# Patient Record
Sex: Male | Born: 1970 | Race: White | Hispanic: No | Marital: Married | Smoking: Never smoker
Health system: Southern US, Community
[De-identification: ages and names within clinical notes are randomized; demographics above are authoritative.]

## PROBLEM LIST (undated history)

## (undated) DIAGNOSIS — F329 Major depressive disorder, single episode, unspecified: Secondary | ICD-10-CM

## (undated) DIAGNOSIS — K649 Unspecified hemorrhoids: Secondary | ICD-10-CM

## (undated) DIAGNOSIS — D649 Anemia, unspecified: Secondary | ICD-10-CM

## (undated) DIAGNOSIS — F32A Depression, unspecified: Secondary | ICD-10-CM

## (undated) DIAGNOSIS — I10 Essential (primary) hypertension: Secondary | ICD-10-CM

## (undated) DIAGNOSIS — Z9289 Personal history of other medical treatment: Secondary | ICD-10-CM

## (undated) DIAGNOSIS — F319 Bipolar disorder, unspecified: Secondary | ICD-10-CM

## (undated) DIAGNOSIS — F419 Anxiety disorder, unspecified: Secondary | ICD-10-CM

## (undated) DIAGNOSIS — K259 Gastric ulcer, unspecified as acute or chronic, without hemorrhage or perforation: Secondary | ICD-10-CM

## (undated) HISTORY — DX: Bipolar disorder, unspecified: F31.9

## (undated) HISTORY — DX: Essential (primary) hypertension: I10

## (undated) HISTORY — DX: Anxiety disorder, unspecified: F41.9

## (undated) HISTORY — DX: Depression, unspecified: F32.A

---
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
## 2005-11-03 ENCOUNTER — Encounter: Admission: RE | Admit: 2005-11-03 | Discharge: 2005-11-03 | Payer: Self-pay | Admitting: Internal Medicine

## 2006-11-21 ENCOUNTER — Encounter: Admission: RE | Admit: 2006-11-21 | Discharge: 2006-11-21 | Payer: Self-pay | Admitting: *Deleted

## 2009-07-09 ENCOUNTER — Emergency Department (HOSPITAL_COMMUNITY): Admission: EM | Admit: 2009-07-09 | Discharge: 2009-07-09 | Payer: Self-pay | Admitting: Emergency Medicine

## 2010-06-12 ENCOUNTER — Encounter: Payer: Self-pay | Admitting: Internal Medicine

## 2010-06-23 ENCOUNTER — Other Ambulatory Visit: Payer: Self-pay | Admitting: Family Medicine

## 2010-06-23 ENCOUNTER — Ambulatory Visit
Admission: RE | Admit: 2010-06-23 | Discharge: 2010-06-23 | Disposition: A | Discharge status: Home / Self Care | Payer: 59 | Source: Ambulatory Visit | Attending: Family Medicine | Admitting: Family Medicine

## 2010-06-23 DIAGNOSIS — T148XXA Other injury of unspecified body region, initial encounter: Secondary | ICD-10-CM

## 2010-08-11 LAB — CBC
MCV: 90.2 fL (ref 78.0–100.0)
Platelets: 161 10*3/uL (ref 150–400)
RBC: 4.9 MIL/uL (ref 4.22–5.81)
RDW: 12.6 % (ref 11.5–15.5)

## 2010-08-11 LAB — POCT I-STAT, CHEM 8
BUN: 18 mg/dL (ref 6–23)
Chloride: 105 mEq/L (ref 96–112)
Creatinine, Ser: 1.2 mg/dL (ref 0.4–1.5)
Glucose, Bld: 81 mg/dL (ref 70–99)
Potassium: 4 mEq/L (ref 3.5–5.1)
Sodium: 137 mEq/L (ref 135–145)

## 2010-08-11 LAB — DIFFERENTIAL
Basophils Absolute: 0 10*3/uL (ref 0.0–0.1)
Basophils Relative: 0 % (ref 0–1)
Eosinophils Absolute: 0.1 10*3/uL (ref 0.0–0.7)
Eosinophils Relative: 2 % (ref 0–5)
Lymphocytes Relative: 32 % (ref 12–46)
Lymphs Abs: 1.4 10*3/uL (ref 0.7–4.0)

## 2010-11-21 ENCOUNTER — Ambulatory Visit (INDEPENDENT_AMBULATORY_CARE_PROVIDER_SITE_OTHER): Payer: 59 | Admitting: Surgery

## 2010-12-12 ENCOUNTER — Ambulatory Visit (INDEPENDENT_AMBULATORY_CARE_PROVIDER_SITE_OTHER): Payer: 59 | Admitting: Surgery

## 2010-12-12 ENCOUNTER — Encounter (INDEPENDENT_AMBULATORY_CARE_PROVIDER_SITE_OTHER): Payer: Self-pay | Admitting: Surgery

## 2010-12-12 VITALS — BP 152/82 | HR 82 | Ht 71.0 in | Wt 248.0 lb

## 2010-12-12 DIAGNOSIS — K648 Other hemorrhoids: Secondary | ICD-10-CM

## 2010-12-12 NOTE — Patient Instructions (Signed)
See Handouts for hemorrhoids

## 2010-12-12 NOTE — Progress Notes (Signed)
Subjective:     Patient ID: Luke Kim, male   DOB: August 18, 1970, 40 y.o.   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  It used to happen only a few times a month. However now he is getting significant bleeding almost every other day. Based on concerns he was seen by Dr. Bosie Clos with gastroenterology. He had a sigmoidoscopy done last year that I am told was normal. I do not have those records in front of me. He was told he may have some mild hemorrhoidal bleeding. It wasn't too concerning.  However now it is worse. He's tried fiber in his diet. He has 2-3 bowel once a day. No severe burning irritation. No painful bowel movements. No drainage or pus. He's never had anything like this before. He's not had any prior interventions done. No family history of the anal rectal problems.  He does we'll get significant bleeding into the toilet. No large blood clots. He comes today with his wife  Review of Systems  Constitutional: Negative for fever, chills and diaphoresis.  HENT: Negative for sore throat and trouble swallowing.   Eyes: Negative for photophobia and visual disturbance.  Respiratory: Negative for choking and shortness of breath.   Cardiovascular: Negative for chest pain and palpitations.  Gastrointestinal: Negative for nausea, vomiting, abdominal pain, diarrhea, constipation, blood in stool, abdominal distention, anal bleeding and rectal pain.  Genitourinary: Negative for dysuria, urgency, difficulty urinating and testicular pain.  Musculoskeletal: Negative for myalgias, arthralgias and gait problem.  Skin: Negative for color change and rash.  Neurological: Negative for dizziness, speech difficulty, weakness and numbness.  Hematological: Negative for adenopathy.  Psychiatric/Behavioral: Negative for hallucinations, confusion and agitation.       Objective:   Physical Exam  Constitutional: He is  oriented to person, place, and time. He appears well-developed and well-nourished. No distress.  HENT:  Head: Normocephalic.  Mouth/Throat: Oropharynx is clear and moist. No oropharyngeal exudate.  Eyes: Conjunctivae and EOM are normal. Pupils are equal, round, and reactive to light. No scleral icterus.  Neck: Normal range of motion. Neck supple. No tracheal deviation present.  Cardiovascular: Normal rate, regular rhythm and intact distal pulses.   Pulmonary/Chest: Effort normal and breath sounds normal. No respiratory distress.  Abdominal: Soft. He exhibits no distension. There is no tenderness. Hernia confirmed negative in the right inguinal area and confirmed negative in the left inguinal area.  Genitourinary:       Mod irritated hemorrhoids R post > L left > R anterior.  No prolapse, fissure, fistula, abscess.  Hygeine good  I injected all 3 piles.  He tolerated procedure well.  Musculoskeletal: Normal range of motion. He exhibits no tenderness.  Lymphadenopathy:    He has no cervical adenopathy.       Right: No inguinal adenopathy present.       Left: No inguinal adenopathy present.  Neurological: He is alert and oriented to person, place, and time. No cranial nerve deficit. He exhibits normal muscle tone. Coordination normal.  Skin: Skin is warm and dry. No rash noted. He is not diaphoretic. No erythema. No pallor.  Psychiatric: He has a normal mood and affect. His behavior is normal. Judgment and thought content normal.       Assessment:     Hemorrhoids as with the bleeding but no significant prolapse. Injected.   Plan:     I gave the standard discussion for  hemorrhoids. I gave him handouts.  Acute thing is for him to have one soft bowel movement a day. Consider backing off weightlifting as probably increasing rectal pressure and and making the bleeding more likely.  If he rebleed,s he can get another injection. The backup plan would be do a THD hemorrhoidal ligation. However  I don't think that its going to come to that. He and his wife felt reassured. They will follow up p.r.n.

## 2011-06-07 ENCOUNTER — Other Ambulatory Visit: Payer: Self-pay | Admitting: Family Medicine

## 2011-06-07 DIAGNOSIS — R7989 Other specified abnormal findings of blood chemistry: Secondary | ICD-10-CM

## 2011-06-20 ENCOUNTER — Other Ambulatory Visit: Payer: 59

## 2011-07-03 ENCOUNTER — Inpatient Hospital Stay: Admission: RE | Admit: 2011-07-03 | Payer: 59 | Source: Ambulatory Visit

## 2014-04-04 ENCOUNTER — Inpatient Hospital Stay (HOSPITAL_COMMUNITY): Payer: 59

## 2014-04-04 ENCOUNTER — Encounter (HOSPITAL_COMMUNITY): Payer: Self-pay | Admitting: Emergency Medicine

## 2014-04-04 ENCOUNTER — Inpatient Hospital Stay (HOSPITAL_COMMUNITY)
Admission: EM | Admit: 2014-04-04 | Discharge: 2014-04-07 | DRG: 394 | Disposition: A | Discharge status: Home / Self Care | Payer: 59 | Attending: Internal Medicine | Admitting: Internal Medicine

## 2014-04-04 DIAGNOSIS — R05 Cough: Secondary | ICD-10-CM

## 2014-04-04 DIAGNOSIS — K644 Residual hemorrhoidal skin tags: Secondary | ICD-10-CM | POA: Diagnosis present

## 2014-04-04 DIAGNOSIS — K449 Diaphragmatic hernia without obstruction or gangrene: Secondary | ICD-10-CM | POA: Diagnosis present

## 2014-04-04 DIAGNOSIS — T426X5A Adverse effect of other antiepileptic and sedative-hypnotic drugs, initial encounter: Secondary | ICD-10-CM | POA: Diagnosis present

## 2014-04-04 DIAGNOSIS — K640 First degree hemorrhoids: Secondary | ICD-10-CM | POA: Diagnosis present

## 2014-04-04 DIAGNOSIS — K648 Other hemorrhoids: Secondary | ICD-10-CM | POA: Diagnosis present

## 2014-04-04 DIAGNOSIS — D62 Acute posthemorrhagic anemia: Secondary | ICD-10-CM | POA: Diagnosis present

## 2014-04-04 DIAGNOSIS — R4182 Altered mental status, unspecified: Secondary | ICD-10-CM

## 2014-04-04 DIAGNOSIS — N179 Acute kidney failure, unspecified: Secondary | ICD-10-CM | POA: Diagnosis present

## 2014-04-04 DIAGNOSIS — K219 Gastro-esophageal reflux disease without esophagitis: Secondary | ICD-10-CM | POA: Diagnosis present

## 2014-04-04 DIAGNOSIS — F419 Anxiety disorder, unspecified: Secondary | ICD-10-CM | POA: Diagnosis present

## 2014-04-04 DIAGNOSIS — Z79899 Other long term (current) drug therapy: Secondary | ICD-10-CM

## 2014-04-04 DIAGNOSIS — N289 Disorder of kidney and ureter, unspecified: Secondary | ICD-10-CM

## 2014-04-04 DIAGNOSIS — R059 Cough, unspecified: Secondary | ICD-10-CM

## 2014-04-04 DIAGNOSIS — R410 Disorientation, unspecified: Secondary | ICD-10-CM | POA: Diagnosis present

## 2014-04-04 DIAGNOSIS — T443X4A Poisoning by other parasympatholytics [anticholinergics and antimuscarinics] and spasmolytics, undetermined, initial encounter: Secondary | ICD-10-CM

## 2014-04-04 DIAGNOSIS — I10 Essential (primary) hypertension: Secondary | ICD-10-CM | POA: Diagnosis present

## 2014-04-04 DIAGNOSIS — D509 Iron deficiency anemia, unspecified: Secondary | ICD-10-CM

## 2014-04-04 DIAGNOSIS — D649 Anemia, unspecified: Secondary | ICD-10-CM | POA: Diagnosis present

## 2014-04-04 LAB — CBC
HCT: 18.4 % — ABNORMAL LOW (ref 39.0–52.0)
Hemoglobin: 5.2 g/dL — CL (ref 13.0–17.0)
MCH: 17.4 pg — AB (ref 26.0–34.0)
MCHC: 28.3 g/dL — ABNORMAL LOW (ref 30.0–36.0)
MCV: 61.7 fL — ABNORMAL LOW (ref 78.0–100.0)
PLATELETS: 350 10*3/uL (ref 150–400)
RBC: 2.98 MIL/uL — ABNORMAL LOW (ref 4.22–5.81)
RDW: 17.8 % — ABNORMAL HIGH (ref 11.5–15.5)
WBC: 9.5 10*3/uL (ref 4.0–10.5)

## 2014-04-04 LAB — ABO/RH: ABO/RH(D): O NEG

## 2014-04-04 LAB — COMPREHENSIVE METABOLIC PANEL
ALT: 41 U/L (ref 0–53)
ANION GAP: 13 (ref 5–15)
AST: 38 U/L — AB (ref 0–37)
Albumin: 3.5 g/dL (ref 3.5–5.2)
Alkaline Phosphatase: 68 U/L (ref 39–117)
BILIRUBIN TOTAL: 0.2 mg/dL — AB (ref 0.3–1.2)
BUN: 26 mg/dL — AB (ref 6–23)
CHLORIDE: 103 meq/L (ref 96–112)
CO2: 22 meq/L (ref 19–32)
Calcium: 8.8 mg/dL (ref 8.4–10.5)
Creatinine, Ser: 1.47 mg/dL — ABNORMAL HIGH (ref 0.50–1.35)
GFR, EST AFRICAN AMERICAN: 67 mL/min — AB (ref >90)
GFR, EST NON AFRICAN AMERICAN: 58 mL/min — AB (ref >90)
GLUCOSE: 124 mg/dL — AB (ref 70–99)
POTASSIUM: 5.2 meq/L (ref 3.7–5.3)
SODIUM: 138 meq/L (ref 137–147)
TOTAL PROTEIN: 6.5 g/dL (ref 6.0–8.3)

## 2014-04-04 LAB — CBC WITH DIFFERENTIAL/PLATELET
BASOS PCT: 0 % (ref 0–1)
Basophils Absolute: 0 10*3/uL (ref 0.0–0.1)
EOS PCT: 0 % (ref 0–5)
Eosinophils Absolute: 0 10*3/uL (ref 0.0–0.7)
HCT: 17.4 % — ABNORMAL LOW (ref 39.0–52.0)
HEMOGLOBIN: 4.9 g/dL — AB (ref 13.0–17.0)
LYMPHS PCT: 7 % — AB (ref 12–46)
Lymphs Abs: 0.6 10*3/uL — ABNORMAL LOW (ref 0.7–4.0)
MCH: 17.4 pg — ABNORMAL LOW (ref 26.0–34.0)
MCHC: 28.2 g/dL — AB (ref 30.0–36.0)
MCV: 61.9 fL — ABNORMAL LOW (ref 78.0–100.0)
Monocytes Absolute: 0.5 10*3/uL (ref 0.1–1.0)
Monocytes Relative: 6 % (ref 3–12)
NEUTROS ABS: 7.3 10*3/uL (ref 1.7–7.7)
Neutrophils Relative %: 87 % — ABNORMAL HIGH (ref 43–77)
Platelets: 320 10*3/uL (ref 150–400)
RBC: 2.81 MIL/uL — AB (ref 4.22–5.81)
RDW: 17.8 % — ABNORMAL HIGH (ref 11.5–15.5)
WBC: 8.4 10*3/uL (ref 4.0–10.5)

## 2014-04-04 LAB — SALICYLATE LEVEL

## 2014-04-04 LAB — RAPID URINE DRUG SCREEN, HOSP PERFORMED
Amphetamines: NOT DETECTED
Barbiturates: NOT DETECTED
Benzodiazepines: NOT DETECTED
COCAINE: NOT DETECTED
OPIATES: POSITIVE — AB
Tetrahydrocannabinol: NOT DETECTED

## 2014-04-04 LAB — URINALYSIS, ROUTINE W REFLEX MICROSCOPIC
Bilirubin Urine: NEGATIVE
GLUCOSE, UA: NEGATIVE mg/dL
Hgb urine dipstick: NEGATIVE
KETONES UR: NEGATIVE mg/dL
LEUKOCYTES UA: NEGATIVE
Nitrite: NEGATIVE
PROTEIN: 30 mg/dL — AB
Specific Gravity, Urine: 1.018 (ref 1.005–1.030)
UROBILINOGEN UA: 0.2 mg/dL (ref 0.0–1.0)
pH: 6 (ref 5.0–8.0)

## 2014-04-04 LAB — PREPARE RBC (CROSSMATCH)

## 2014-04-04 LAB — HEMOGLOBIN: HEMOGLOBIN: 5.2 g/dL — AB (ref 13.0–17.0)

## 2014-04-04 LAB — ETHANOL: Alcohol, Ethyl (B): <11 mg/dL (ref 0–11)

## 2014-04-04 LAB — ACETAMINOPHEN LEVEL: Acetaminophen (Tylenol), Serum: <15 ug/mL (ref 10–30)

## 2014-04-04 LAB — URINE MICROSCOPIC-ADD ON

## 2014-04-04 LAB — MRSA PCR SCREENING: MRSA by PCR: NEGATIVE

## 2014-04-04 LAB — GLUCOSE, CAPILLARY: GLUCOSE-CAPILLARY: 122 mg/dL — AB (ref 70–99)

## 2014-04-04 MED ORDER — HYDRALAZINE HCL 20 MG/ML IJ SOLN: 5.0000 mg | INTRAMUSCULAR | Status: DC | PRN

## 2014-04-04 MED ORDER — SODIUM CHLORIDE 0.9 % IV SOLN
INTRAVENOUS | Status: DC
  Administered 2014-04-04 – 2014-04-06 (×3): via INTRAVENOUS

## 2014-04-04 MED ORDER — SODIUM CHLORIDE 0.9 % IJ SOLN
3.0000 mL | Freq: Two times a day (BID) | INTRAMUSCULAR | Status: DC
  Administered 2014-04-06 – 2014-04-07 (×3): 3 mL via INTRAVENOUS

## 2014-04-04 MED ORDER — ACETAMINOPHEN 325 MG PO TABS: 650.0000 mg | ORAL_TABLET | Freq: Four times a day (QID) | ORAL | Status: DC | PRN

## 2014-04-04 MED ORDER — ACETAMINOPHEN 650 MG RE SUPP: 650.0000 mg | Freq: Four times a day (QID) | RECTAL | Status: DC | PRN

## 2014-04-04 MED ORDER — ONDANSETRON HCL 4 MG/2ML IJ SOLN: 4.0000 mg | Freq: Three times a day (TID) | INTRAMUSCULAR | Status: DC | PRN

## 2014-04-04 MED ORDER — ONDANSETRON HCL 4 MG/2ML IJ SOLN: 4.0000 mg | Freq: Four times a day (QID) | INTRAMUSCULAR | Status: DC | PRN

## 2014-04-04 MED ORDER — INFLUENZA VAC SPLIT QUAD 0.5 ML IM SUSY
0.5000 mL | PREFILLED_SYRINGE | INTRAMUSCULAR | Status: AC
  Administered 2014-04-07: 0.5 mL via INTRAMUSCULAR
  Filled 2014-04-04: qty 0.5

## 2014-04-04 MED ORDER — ALPRAZOLAM 0.5 MG PO TABS
1.0000 mg | ORAL_TABLET | Freq: Two times a day (BID) | ORAL | Status: DC
  Administered 2014-04-04 – 2014-04-07 (×6): 1 mg via ORAL
  Filled 2014-04-04 (×6): qty 2

## 2014-04-04 MED ORDER — SODIUM CHLORIDE 0.9 % IV SOLN
Freq: Once | INTRAVENOUS | Status: AC
  Administered 2014-04-04: via INTRAVENOUS

## 2014-04-04 MED ORDER — SODIUM CHLORIDE 0.9 % IV BOLUS (SEPSIS)
1000.0000 mL | Freq: Once | INTRAVENOUS | Status: AC
  Administered 2014-04-04: 1000 mL via INTRAVENOUS

## 2014-04-04 MED ORDER — AMLODIPINE BESYLATE 10 MG PO TABS
10.0000 mg | ORAL_TABLET | Freq: Every day | ORAL | Status: DC
  Administered 2014-04-04 – 2014-04-07 (×4): 10 mg via ORAL
  Filled 2014-04-04 (×4): qty 1

## 2014-04-04 MED ORDER — ONDANSETRON HCL 4 MG PO TABS: 4.0000 mg | ORAL_TABLET | Freq: Four times a day (QID) | ORAL | Status: DC | PRN
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
## 2014-04-05 LAB — TYPE AND SCREEN
ABO/RH(D): O NEG
Antibody Screen: NEGATIVE
UNIT DIVISION: 0
Unit division: 0

## 2014-04-05 LAB — COMPREHENSIVE METABOLIC PANEL
ALBUMIN: 3.4 g/dL — AB (ref 3.5–5.2)
ALK PHOS: 72 U/L (ref 39–117)
ALT: 39 U/L (ref 0–53)
AST: 46 U/L — AB (ref 0–37)
Anion gap: 13 (ref 5–15)
BUN: 16 mg/dL (ref 6–23)
CALCIUM: 8.8 mg/dL (ref 8.4–10.5)
CO2: 23 mEq/L (ref 19–32)
Chloride: 100 mEq/L (ref 96–112)
Creatinine, Ser: 1.13 mg/dL (ref 0.50–1.35)
GFR calc non Af Amer: 79 mL/min — ABNORMAL LOW (ref >90)
GLUCOSE: 88 mg/dL (ref 70–99)
POTASSIUM: 4.3 meq/L (ref 3.7–5.3)
SODIUM: 136 meq/L — AB (ref 137–147)
Total Bilirubin: 0.5 mg/dL (ref 0.3–1.2)
Total Protein: 6.7 g/dL (ref 6.0–8.3)

## 2014-04-05 LAB — VITAMIN B12: VITAMIN B 12: 678 pg/mL (ref 211–911)

## 2014-04-05 LAB — IRON AND TIBC
Iron: <10 ug/dL — ABNORMAL LOW (ref 42–135)
UIBC: 535 ug/dL — ABNORMAL HIGH (ref 125–400)

## 2014-04-05 LAB — CBC
HEMATOCRIT: 24.1 % — AB (ref 39.0–52.0)
Hemoglobin: 7.1 g/dL — ABNORMAL LOW (ref 13.0–17.0)
MCH: 19.4 pg — AB (ref 26.0–34.0)
MCHC: 29.5 g/dL — AB (ref 30.0–36.0)
MCV: 65.8 fL — AB (ref 78.0–100.0)
Platelets: 346 10*3/uL (ref 150–400)
RBC: 3.66 MIL/uL — AB (ref 4.22–5.81)
RDW: 21 % — ABNORMAL HIGH (ref 11.5–15.5)
WBC: 8.2 10*3/uL (ref 4.0–10.5)

## 2014-04-05 MED ORDER — PEG 3350-KCL-NA BICARB-NACL 420 G PO SOLR
4000.0000 mL | Freq: Once | ORAL | Status: AC
  Administered 2014-04-05: 4000 mL via ORAL
  Filled 2014-04-05: qty 4000

## 2014-04-05 MED ORDER — BISACODYL 5 MG PO TBEC
10.0000 mg | DELAYED_RELEASE_TABLET | Freq: Once | ORAL | Status: AC
  Administered 2014-04-05: 10 mg via ORAL
  Filled 2014-04-05: qty 2

## 2014-04-05 NOTE — Consult Note (Signed)
[facility] Gastroenterology Consultation Note  Referring Provider:  Dr. Rickey Barbara [facility]) Primary Care Physician:  Thora Lance, MD Primary Gastroenterologist:  Dr. Charlott Rakes  Reason for Consultation:  Iron-deficiency anemia, hematochezia  HPI: Luke Kim is a 43 y.o. male admitted to hospital after he passed out yesterday at work.  He has had intermittent troubles with hematochezia for many years, would start and then resolve with expectant management.  He saw Dr. Bosie Clos in 2011, at which time he had a CT scan (unrevealing) as well as a sigmoidoscopy which showed internal hemorrhoids but was otherwise normal.  Patient's hematochezia continued after seeing Dr. Bosie Clos, but would eventually slow down enough each time that patient didn't end up feeling he needed medical evaluation.  Of late, he's had more rampant bleeding, as well as sense of looser (albeit still intermittently so) stools.  No hematemesis, nausea, vomiting, dysphagia.  No change in bowel habits, specifically no trend to constipation, obstipation, fecal straining.  No abdominal pain.  Good appetite.  No weight loss.  No prior full colonoscopy.  No prior endoscopy.  ? Father with ulcers.  ? Family members with colon polyps.   Past Medical History  Diagnosis Date  . Anxiety   . Hypertension     No past surgical history on file.  Prior to Admission medications   Medication Sig Start Date End Date Taking? Authorizing Provider  ALPRAZolam Prudy Feeler) 1 MG tablet Take 1 mg by mouth 2 (two) times daily.     Yes Historical Provider, MD  amLODipine (NORVASC) 10 MG tablet Take 10 mg by mouth daily. 03/16/14  Yes Historical Provider, MD  benazepril (LOTENSIN) 20 MG tablet Take 20 mg by mouth 2 (two) times daily.     Yes Historical Provider, MD  diphenhydrAMINE (SOMINEX) 25 MG tablet Take 25 mg by mouth at bedtime as needed.     Yes Historical Provider, MD  hydrOXYzine (ATARAX/VISTARIL) 50 MG tablet Take 50-100 mg by mouth at  bedtime as needed.  03/04/14  Yes Historical Provider, MD  methylcellulose oral powder Take 1 packet by mouth daily.    Yes Historical Provider, MD  Milk Thistle 140 MG CAPS Take 140 mg by mouth daily.    Yes Historical Provider, MD  sertraline (ZOLOFT) 25 MG tablet Take 25 mg by mouth daily.   Yes Historical Provider, MD  vitamin B-12 (CYANOCOBALAMIN) 100 MCG tablet Take 50 mcg by mouth daily.     Yes Historical Provider, MD    Current Facility-Administered Medications  Medication Dose Route Frequency Provider Last Rate Last Dose  . 0.9 %  sodium chloride infusion   Intravenous Continuous Jerald Kief, MD 75 mL/hr at 04/04/14 2330    . acetaminophen (TYLENOL) tablet 650 mg  650 mg Oral Q6H PRN Jerald Kief, MD       Or  . acetaminophen (TYLENOL) suppository 650 mg  650 mg Rectal Q6H PRN Jerald Kief, MD      . ALPRAZolam Prudy Feeler) tablet 1 mg  1 mg Oral BID Jerald Kief, MD   1 mg at 04/05/14 0948  . amLODipine (NORVASC) tablet 10 mg  10 mg Oral Daily Jerald Kief, MD   10 mg at 04/05/14 0948  . bisacodyl (DULCOLAX) EC tablet 10 mg  10 mg Oral Once Willis Modena, MD      . hydrALAZINE (APRESOLINE) injection 5 mg  5 mg Intravenous Q4H PRN Jerald Kief, MD      . Influenza vac split quadrivalent  PF (FLUARIX) injection 0.5 mL  0.5 mL Intramuscular Tomorrow-1000 Jerald KiefStephen K Chiu, MD      . ondansetron [facility](ZOFRAN) tablet 4 mg  4 mg Oral Q6H PRN Jerald KiefStephen K Chiu, MD       Or  . ondansetron [facility](ZOFRAN) injection 4 mg  4 mg Intravenous Q6H PRN Jerald KiefStephen K Chiu, MD      . polyethylene glycol-electrolytes (NuLYTELY/GoLYTELY) solution 4,000 mL  4,000 mL Oral Once Willis ModenaWilliam Remmington Urieta, MD      . sodium chloride 0.9 % injection 3 mL  3 mL Intravenous Q12H Jerald KiefStephen K Chiu, MD   3 mL at 04/04/14 2154    Allergies as of 04/04/2014  . (No Known Allergies)    No family history on file.  History   Social History  . Marital Status: Married    Spouse Name: N/A    Number of Children: N/A  . Years of  Education: N/A   Occupational History  . Not on file.   Social History Main Topics  . Smoking status: Never Smoker   . Smokeless tobacco: Not on file  . Alcohol Use: Yes  . Drug Use: No  . Sexual Activity: Not on file   Other Topics Concern  . Not on file   Social History Narrative    Review of Systems: Positive = bold Gen: Denies any fever, chills, rigors, night sweats, anorexia, fatigue, weakness, malaise, involuntary weight loss, and sleep disorder CV: Denies chest pain, angina, palpitations, syncope, orthopnea, PND, peripheral edema, and claudication. Resp: Denies dyspnea, cough, sputum, wheezing, coughing up blood. GI: Described in detail in HPI.    GU : Denies urinary burning, blood in urine, urinary frequency, urinary hesitancy, nocturnal urination, and urinary incontinence. MS: Denies joint pain or swelling.  Denies muscle weakness, cramps, atrophy.  Derm: Denies rash, itching, oral ulcerations, hives, unhealing ulcers.  Psych: Denies depression, anxiety, memory loss, suicidal ideation, hallucinations,  and confusion. Heme: Denies bruising, bleeding, and enlarged lymph nodes. Neuro:  Denies any headaches, dizziness, paresthesias. Endo:  Denies any problems with DM, thyroid, adrenal function.  Physical Exam: Vital signs in last 24 hours: Temp:  [98 F (36.7 C)-100 F (37.8 C)] 98 F (36.7 C) (11/15 1226) Pulse Rate:  [93-116] 93 (11/15 1226) Resp:  [14-34] 17 (11/15 1226) BP: (114-161)/(60-95) 120/60 mmHg (11/15 1226) SpO2:  [90 %-100 %] 100 % (11/15 1226) Weight:  [111.6 kg (246 lb 0.5 oz)-113.399 kg (250 lb)] 111.6 kg (246 lb 0.5 oz) (11/14 1747)   General:   Alert,  Well-developed, well-nourished, pleasant and cooperative in NAD Head:  Normocephalic and atraumatic. Eyes:  Sclera clear, no icterus.   Conjunctiva pale Ears:  Normal auditory acuity. Nose:  No deformity, discharge,  or lesions. Mouth:  No deformity or lesions.  Oropharynx pale but somewhat  dry. Neck:  Supple; no masses or thyromegaly. Lungs:  Clear throughout to auscultation.   No wheezes, crackles, or rhonchi. No acute distress. Heart:  Regular rate and rhythm; no murmurs, clicks, rubs,  or gallops. Abdomen:  Soft, nontender and nondistended. No masses, hepatosplenomegaly or hernias noted. Normal bowel sounds, without guarding, and without rebound.     Msk:  Symmetrical without gross deformities. Normal posture. Pulses:  Normal pulses noted. Extremities:  Without clubbing or edema. Neurologic:  Alert and  oriented x4;  Diffusely weak, otherwise grossly normal neurologically. Skin:  Intact without significant lesions or rashes. Psych:  Alert and cooperative. Normal mood and affect.  Lab Results:  Recent Labs  04/04/14 1333  04/04/14 1414 04/04/14 1629 04/05/14 0235  WBC 9.5 8.4  --  8.2  HGB 5.2* 4.9* 5.2* 7.1*  HCT 18.4* 17.4*  --  24.1*  PLT 350 320  --  346   BMET  Recent Labs  04/04/14 1333 04/05/14 0235  NA 138 136*  K 5.2 4.3  CL 103 100  CO2 22 23  GLUCOSE 124* 88  BUN 26* 16  CREATININE 1.47* 1.13  CALCIUM 8.8 8.8   LFT  Recent Labs  04/05/14 0235  PROT 6.7  ALBUMIN 3.4*  AST 46*  ALT 39  ALKPHOS 72  BILITOT 0.5   PT/INR No results for input(s): LABPROT, INR in the last 72 hours.  Studies/Results: Dg Chest 2 View  04/04/2014   CLINICAL DATA:  Cough.  ICD10: R 0 5  EXAM: CHEST  2 VIEW  COMPARISON:  None.  FINDINGS: Midline trachea. Mild cardiomegaly. Mediastinal contours otherwise within normal limits. No pleural effusion or pneumothorax. No lobar consolidation. Minimally low lung volumes on the frontal radiograph. The pulmonary interstitium is mildly prominent.  IMPRESSION: Mild cardiomegaly. Apparent pulmonary interstitial prominence is favored to be due to mildly low lung volumes. Mild pulmonary venous congestion could look similar.   Electronically Signed   By: Jeronimo GreavesKyle  Talbot M.D.   On: 04/04/2014 15:57    Impression:  1.   Hematochezia, intermittent and ongoing for years.  Hemorrhoids is still a leading consideration. 2.  Chronic, but intermittent, loose stools. 3.  Microcytic anemia with iron-deficiency indices.  In this case, I'm not convinced patient's intermittent hemorrhoidal bleeding is causing his profound iron-deficiency anemia, though this is still possible.  Patient could have celiac disease, AVMs, inflammatory bowel disease, or host of other processes at work.  Given patient's lack of change in bowel habits and lack of weight loss, I'm less inclined to think this is malignancy, but certainly it is still a consideration.  Plan:  1.  Agree with volume repletion (IV fluids, transfusion PRBCs). 2.  Endoscopy (with small bowel biopsies to exclude celiac disease) and colonoscopy (with terminal ileal investigation, as deemed necessary based on rest of findings on colonoscopy) for tomorrow, tentatively around 11:30 am.  Will us propofol. 3.  Clear liquid diet today, NPO after midnight. 4.  Next step in management pending endoscopy and colonoscopy findings.   LOS: 1 day   Nyimah Shadduck M  04/05/2014, 1:41 PM

## 2014-04-05 NOTE — Progress Notes (Signed)
[facility] PROGRESS NOTE  Luke MachoLuke Kim ZOX:[id number] DOB: 07/09/1970 DOA: 04/04/2014 PCP: Thora LanceEHINGER,ROBERT R, MD  Assessment/Plan: 1. Microcytic anemia likely from LGI bleeding 1. No evidence of recurrent bleed since admission 2. Hgb rose nicely from 5 to 7gm after 2 units PRBC's 3. Pt is iron deficient with iron of <10 4. Pt is s/p flex sig in 2012 by Dr. Bosie ClosSchooler, with findings of hemorrhoids per report 5. Given presenting anemia, will consult GI for further assistance 6. Pt otherwise hemodynamically stable at this time 2. Mental status change 1. Much improved 2. Question sedating meds taken the night prior to admission 3. Hx hemorrhoids 1. Previously treated in 2012 2. Question if related to above issue 4. DVT prophylaxis 1. SCD's 5. HTN 1. Stable 2. Monitor 6. ARF 1. Resolved with PRBC transfusion  Code Status: Full Family Communication: Pt in room Disposition Plan: pending   Consultants:  GI  Procedures:    Antibiotics:  none (indicate start date, and stop date if known)  HPI/Subjective: No acute events noted overnight  Objective: Filed Vitals:   04/04/14 2332 04/04/14 2335 04/05/14 0402 04/05/14 0811  BP: 122/69 122/69 129/67 122/65  Pulse: 96 95 100 95  Temp: 98.2 F (36.8 C)  98.1 F (36.7 C) 98 F (36.7 C)  TempSrc: Oral  Oral Oral  Resp: 28 26 29 14   Height:      Weight:      SpO2: 100% 99% 99% 98%    Intake/Output Summary (Last 24 hours) at 04/05/14 0953 Last data filed at 04/05/14 0900  Gross per 24 hour  Intake   1440 ml  Output   6125 ml  Net  -4685 ml   Filed Weights   04/04/14 1342 04/04/14 1747  Weight: 113.399 kg (250 lb) 111.6 kg (246 lb 0.5 oz)    Exam:   General:  Awake, in nad  Cardiovascular: regular, s1, s2  Respiratory: normal resp effort, no wheezing  Abdomen: soft,nondistended  Musculoskeletal: perfused, no clubbing   Data Reviewed: Basic Metabolic Panel:  Recent Labs Lab 04/04/14 1333  04/05/14 0235  NA 138 136*  K 5.2 4.3  CL 103 100  CO2 22 23  GLUCOSE 124* 88  BUN 26* 16  CREATININE 1.47* 1.13  CALCIUM 8.8 8.8   Liver Function Tests:  Recent Labs Lab 04/04/14 1333 04/05/14 0235  AST 38* 46*  ALT 41 39  ALKPHOS 68 72  BILITOT 0.2* 0.5  PROT 6.5 6.7  ALBUMIN 3.5 3.4*   No results for input(s): LIPASE, AMYLASE in the last 168 hours. No results for input(s): AMMONIA in the last 168 hours. CBC:  Recent Labs Lab 04/04/14 1333 04/04/14 1414 04/04/14 1629 04/05/14 0235  WBC 9.5 8.4  --  8.2  NEUTROABS  --  7.3  --   --   HGB 5.2* 4.9* 5.2* 7.1*  HCT 18.4* 17.4*  --  24.1*  MCV 61.7* 61.9*  --  65.8*  PLT 350 320  --  346   Cardiac Enzymes: No results for input(s): CKTOTAL, CKMB, CKMBINDEX, TROPONINI in the last 168 hours. BNP (last 3 results) No results for input(s): PROBNP in the last 8760 hours. CBG:  Recent Labs Lab 04/04/14 1741  GLUCAP 122*    Recent Results (from the past 240 hour(s))  MRSA PCR Screening     Status: None   Collection Time: 04/04/14  5:55 PM  Result Value Ref Range Status   MRSA by PCR NEGATIVE NEGATIVE Final    Comment:  The GeneXpert MRSA Assay (FDA approved for NASAL specimens only), is one component of a comprehensive MRSA colonization surveillance program. It is not intended to diagnose MRSA infection nor to guide or monitor treatment for MRSA infections.      Studies: Dg Chest 2 View  04/04/2014   CLINICAL DATA:  Cough.  ICD10: R 0 5  EXAM: CHEST  2 VIEW  COMPARISON:  None.  FINDINGS: Midline trachea. Mild cardiomegaly. Mediastinal contours otherwise within normal limits. No pleural effusion or pneumothorax. No lobar consolidation. Minimally low lung volumes on the frontal radiograph. The pulmonary interstitium is mildly prominent.  IMPRESSION: Mild cardiomegaly. Apparent pulmonary interstitial prominence is favored to be due to mildly low lung volumes. Mild pulmonary venous congestion could look  similar.   Electronically Signed   By: Jeronimo GreavesKyle  Talbot M.D.   On: 04/04/2014 15:57    Scheduled Meds: . ALPRAZolam  1 mg Oral BID  . amLODipine  10 mg Oral Daily  . Influenza vac split quadrivalent PF  0.5 mL Intramuscular Tomorrow-1000  . sodium chloride  3 mL Intravenous Q12H   Continuous Infusions: . sodium chloride 75 mL/hr at 04/04/14 2330    Principal Problem:   Hemorrhoids, internal, with bleeding Active Problems:   Severe anemia   Mental status change   Microcytic anemia   Anemia  Time spent: 35min  Davan Hark K  [facility] Pager [phone]. If 7PM-7AM, please contact night-coverage at [email or web address], password [email or web address] 04/05/2014, 9:53 AM  LOS: 1 day

## 2014-04-05 NOTE — Plan of Care (Signed)
Problem: Consults Goal: General Medical Patient Education See Patient Education Module for specific education.  Outcome: Progressing Goal: Skin Care Protocol Initiated - if Braden Score 18 or less If consults are not indicated, leave blank or document N/A  Outcome: Not Applicable Date Met:  48/25/00 Goal: Nutrition Consult-if indicated Outcome: Not Applicable Date Met:  37/04/88 Goal: Diabetes Guidelines if Diabetic/Glucose > 140 If diabetic or lab glucose is > 140 mg/dl - Initiate Diabetes/Hyperglycemia Guidelines & Document Interventions  Outcome: Not Applicable Date Met:  89/16/94  Problem: Phase I Progression Outcomes Goal: Pain controlled with appropriate interventions Outcome: Progressing Goal: OOB as tolerated unless otherwise ordered Outcome: Progressing Goal: Initial discharge plan identified Outcome: Progressing Goal: Voiding-avoid urinary catheter unless indicated Outcome: Completed/Met Date Met:  04/05/14 Goal: Hemodynamically stable Outcome: Progressing  Problem: Phase II Progression Outcomes Goal: Progress activity as tolerated unless otherwise ordered Outcome: Progressing Goal: Discharge plan established Outcome: Progressing Goal: Vital signs remain stable Outcome: Progressing Goal: IV changed to normal saline lock Outcome: Progressing Goal: Obtain order to discontinue catheter if appropriate Outcome: Not Applicable Date Met:  50/38/88  Problem: Phase III Progression Outcomes Goal: Pain controlled on oral analgesia Outcome: Progressing Goal: Activity at appropriate level-compared to baseline (UP IN CHAIR FOR HEMODIALYSIS)  Outcome: Progressing Goal: Voiding independently Outcome: Completed/Met Date Met:  04/05/14 Goal: IV/normal saline lock discontinued Outcome: Progressing Goal: Foley discontinued Outcome: Not Applicable Date Met:  28/00/34 Goal: Discharge plan remains appropriate-arrangements made Outcome: Progressing  Problem: Discharge  Progression Outcomes Goal: Discharge plan in place and appropriate Outcome: Progressing Goal: Pain controlled with appropriate interventions Outcome: Progressing Goal: Hemodynamically stable Outcome: Progressing Goal: Complications resolved/controlled Outcome: Progressing Goal: Tolerating diet Outcome: Progressing Goal: Activity appropriate for discharge plan Outcome: Progressing

## 2014-04-06 ENCOUNTER — Encounter (HOSPITAL_COMMUNITY)
Admission: EM | Disposition: A | Discharge status: Home / Self Care | Payer: Self-pay | Source: Home / Self Care | Attending: Internal Medicine

## 2014-04-06 ENCOUNTER — Inpatient Hospital Stay (HOSPITAL_COMMUNITY): Payer: 59 | Admitting: Anesthesiology

## 2014-04-06 ENCOUNTER — Encounter (HOSPITAL_COMMUNITY): Payer: Self-pay

## 2014-04-06 HISTORY — PX: COLONOSCOPY WITH PROPOFOL: SHX5780

## 2014-04-06 HISTORY — PX: ESOPHAGOGASTRODUODENOSCOPY (EGD) WITH PROPOFOL: SHX5813

## 2014-04-06 LAB — CBC
HCT: 32 % — ABNORMAL LOW (ref 39.0–52.0)
Hemoglobin: 9.2 g/dL — ABNORMAL LOW (ref 13.0–17.0)
MCH: 18.9 pg — AB (ref 26.0–34.0)
MCHC: 28.8 g/dL — AB (ref 30.0–36.0)
MCV: 65.8 fL — AB (ref 78.0–100.0)
PLATELETS: 430 10*3/uL — AB (ref 150–400)
RBC: 4.86 MIL/uL (ref 4.22–5.81)
RDW: 20.9 % — ABNORMAL HIGH (ref 11.5–15.5)
WBC: 5.7 10*3/uL (ref 4.0–10.5)

## 2014-04-06 LAB — FOLATE RBC: RBC Folate: 2121 ng/mL — ABNORMAL HIGH (ref >280)

## 2014-04-06 SURGERY — ESOPHAGOGASTRODUODENOSCOPY (EGD) WITH PROPOFOL
Anesthesia: Monitor Anesthesia Care | Laterality: Left

## 2014-04-06 MED ORDER — MIDAZOLAM HCL 2 MG/2ML IJ SOLN: 0.5000 mg | Freq: Once | INTRAMUSCULAR | Status: DC | PRN

## 2014-04-06 MED ORDER — OXYCODONE HCL 5 MG/5ML PO SOLN: 5.0000 mg | Freq: Once | ORAL | Status: DC | PRN

## 2014-04-06 MED ORDER — PANTOPRAZOLE SODIUM 40 MG PO TBEC
40.0000 mg | DELAYED_RELEASE_TABLET | Freq: Two times a day (BID) | ORAL | Status: DC
  Administered 2014-04-06 – 2014-04-07 (×2): 40 mg via ORAL
  Filled 2014-04-06 (×2): qty 1

## 2014-04-06 MED ORDER — PROPOFOL INFUSION 10 MG/ML OPTIME
INTRAVENOUS | Status: DC | PRN
  Administered 2014-04-06: 75 ug/kg/min via INTRAVENOUS

## 2014-04-06 MED ORDER — MIDAZOLAM HCL 2 MG/2ML IJ SOLN
INTRAMUSCULAR | Status: AC
  Filled 2014-04-06: qty 2

## 2014-04-06 MED ORDER — PROMETHAZINE HCL 25 MG/ML IJ SOLN: 6.2500 mg | INTRAMUSCULAR | Status: DC | PRN

## 2014-04-06 MED ORDER — OXYCODONE HCL 5 MG PO TABS: 5.0000 mg | ORAL_TABLET | Freq: Once | ORAL | Status: DC | PRN

## 2014-04-06 MED ORDER — LACTATED RINGERS IV SOLN
INTRAVENOUS | Status: DC
  Administered 2014-04-06: 1000 mL via INTRAVENOUS

## 2014-04-06 MED ORDER — PROPOFOL 10 MG/ML IV BOLUS
INTRAVENOUS | Status: DC | PRN
  Administered 2014-04-06 (×5): 40 mg via INTRAVENOUS
  Administered 2014-04-06: 20 mg via INTRAVENOUS
  Administered 2014-04-06: 40 mg via INTRAVENOUS
  Administered 2014-04-06: 50 mg via INTRAVENOUS
  Administered 2014-04-06 (×2): 40 mg via INTRAVENOUS
  Administered 2014-04-06: 20 mg via INTRAVENOUS

## 2014-04-06 MED ORDER — MIDAZOLAM HCL 5 MG/5ML IJ SOLN
INTRAMUSCULAR | Status: DC | PRN
  Administered 2014-04-06: 2 mg via INTRAVENOUS

## 2014-04-06 MED ORDER — MEPERIDINE HCL 100 MG/ML IJ SOLN: 6.2500 mg | INTRAMUSCULAR | Status: DC | PRN

## 2014-04-06 MED ORDER — SODIUM CHLORIDE 0.9 % IV SOLN: INTRAVENOUS | Status: DC

## 2014-04-06 MED ORDER — HYDROMORPHONE HCL 1 MG/ML IJ SOLN: 0.2500 mg | INTRAMUSCULAR | Status: DC | PRN

## 2014-04-06 NOTE — Clinical Documentation Improvement (Signed)
Please specify diagnosis related to below supporting information if appropriate.  Possible Clinical Conditions    Expected Acute Blood Loss Anemia  Acute Blood Loss Anemia  Acute on chronic blood loss anemia  Chronic blood loss anemia  Precipitous drop in Hematocrit  Other Condition________________  Cannot Clinically Determine    Supporting Information:  Per 11/15 /15 MD progress note + Microcytic anemia likely from LGI bleeding  1. No evidence of recurrent bleed since admission  2. Hgb rose nicely from 5 to 7gm after 2 units PRBC's  3. Pt is iron deficient with iron of <10  4. Pt is s/p flex sig in 2012 by Dr. Bosie ClosSchooler, with findings of hemorrhoids per report  5. Given presenting anemia, will consult GI for further assistance   Per 04/05/14 Consult note= Hematochezia, intermittent and ongoing for years. Hemorrhoids is still a leading consideration.   Microcytic anemia with iron-deficiency indices. In this case, I'm not convinced patient's intermittent hemorrhoidal bleeding is causing his profound iron-deficiency anemia, though this is still possible. Patient could have celiac disease, AVMs, inflammatory bowel disease, or host of other processes at work. Given patient's lack of change in bowel habits and lack of weight loss, I'm less inclined to think this is malignancy, but certainly it is still a consideration.    Patient's labs during this admission  Component     Latest Ref Rng 04/04/2014 04/04/2014         1:33 PM  2:14 PM  Hemoglobin     13.0 - 17.0 g/dL 5.2 (LL) 4.9 (LL)  HCT     39.0 - 52.0 % 18.4 (L) 17.4 (L)   Component     Latest Ref Rng 04/04/2014         4:29 PM  Hemoglobin     13.0 - 17.0 g/dL 5.2 (LL)  HCT     16.139.0 - 52.0 %    Component     Latest Ref Rng 04/05/2014          Hemoglobin     13.0 - 17.0 g/dL 7.1 (L)  HCT     09.639.0 - 52.0 % 24.1 (L)    Thank You, Shelda Palarlene H Donyea Gafford ,RN Clinical Documentation Specialist:  [phone]  [facility]- Health Information Management

## 2014-04-06 NOTE — Progress Notes (Signed)
Utilization review completed. Vanellope Passmore, RN, BSN. 

## 2014-04-06 NOTE — Transfer of Care (Signed)
Immediate Anesthesia Transfer of Care Note  Patient: Luke Kim  Procedure(s) Performed: Procedure(s): ESOPHAGOGASTRODUODENOSCOPY (EGD) WITH PROPOFOL (Left) COLONOSCOPY WITH PROPOFOL (Left)  Patient Location: PACU  Anesthesia Type:MAC  Level of Consciousness: awake and alert   Airway & Oxygen Therapy: Patient Spontanous Breathing and Patient connected to nasal cannula oxygen  Post-op Assessment: Report given to PACU RN and Post -op Vital signs reviewed and stable  Post vital signs: Reviewed and stable  Complications: No apparent anesthesia complications

## 2014-04-06 NOTE — Op Note (Signed)
[facility] [address] [place], [id number]   ENDOSCOPY PROCEDURE REPORT  PATIENT: Luke Kim, Luke Kim  MR#: [id number] BIRTHDATE: Feb 14, 1971 , 43  yrs. old GENDER: male ENDOSCOPIST: Vida RiggerMarc Suliman Termini, MD REFERRED BY: PROCEDURE DATE:  04/06/2014 PROCEDURE:  EGD w/ biopsy ASA CLASS:     Class II INDICATIONS:  iron deficiency anemia and hematochezia. MEDICATIONS: Propofol 100 mg IV TOPICAL ANESTHETIC: none  DESCRIPTION OF PROCEDURE: After the risks benefits and alternatives of the procedure were thoroughly explained, informed consent was obtained.  The Pentax Gastroscope [id number] endoscope was introduced through the mouth and advanced to the second portion of the duodenum , Without limitations.  The instrument was slowly withdrawn as the mucosa was fully examined.    findings are recorded below and the patient tolerated the procedure well there was no obvious immediate complication       Retroflexed views revealed no abnormalities.     The scope was then withdrawn from the patient and the procedure completed.  COMPLICATIONS: There were no immediate complications.  ENDOSCOPIC IMPRESSION: 1. Tiny hiatal hernia with mild distal esophagitis status post biopsy 2. Multiple duodenal bulb shallow erosions status post stomach biopsy to rule out H. pylori 3. Otherwise within normal limits EGD  RECOMMENDATIONS: avoid aspirin and nonsteroidals ,await pathology, once a day pump inhibitors ,see a surgeon regarding hemorrhoids     and follow-up in one month to recheck guaiacs and CBCs and make sure no further workup and plans are needed and put him on iron on discharge  REPEAT EXAM: as needed  eSigned:  Vida RiggerMarc Geisha Abernathy, MD 04/06/2014 5:02 PM    CC:  CPT CODES: ICD CODES:  The ICD and CPT codes recommended by this software are interpretations from the data that the clinical staff has captured with the software.  The verification of the translation of this report to  the ICD and CPT codes and modifiers is the sole responsibility of the health care institution and practicing physician where this report was generated.  [employer] will not be held responsible for the validity of the ICD and CPT codes included on this report.  AMA assumes no liability for data contained or not contained herein. CPT is a Publishing rights managerregistered trademark of the [employer].  PATIENT NAME:  Luke Kim, Luke Kim MR#: [id number]

## 2014-04-06 NOTE — Progress Notes (Signed)
Luke Kim 3:24 PM  Subjective: Patient seen and examined and case discussed with Dr. Dulce Sellarutlaw as well as the patient and his wife and no new symptoms  Objective: Vital signs stable afebrile no acute distress hemoglobin increased exam please see preassessment evaluation  Assessment: Symptomatic anemia bright red blood per rectum  Plan: Okay to proceed with colonoscopy and if needed endoscopy with anesthesia assistance and those procedures including the risks were discussed with the patient and his wife and will proceed this afternoon with further workup and plans and recommendations pending those findings  [facility] HealthMAGOD,Nelissa Bolduc E

## 2014-04-06 NOTE — Plan of Care (Signed)
Problem: Consults Goal: General Medical Patient Education See Patient Education Module for specific education.  Outcome: Progressing  Problem: Phase I Progression Outcomes Goal: Pain controlled with appropriate interventions Outcome: Progressing Goal: OOB as tolerated unless otherwise ordered Outcome: Progressing Goal: Initial discharge plan identified Outcome: Progressing Goal: Hemodynamically stable Outcome: Progressing  Problem: Phase II Progression Outcomes Goal: Progress activity as tolerated unless otherwise ordered Outcome: Progressing Goal: Discharge plan established Outcome: Progressing Goal: Vital signs remain stable Outcome: Progressing  Problem: Phase III Progression Outcomes Goal: Pain controlled on oral analgesia Outcome: Progressing Goal: Activity at appropriate level-compared to baseline (UP IN CHAIR FOR HEMODIALYSIS)  Outcome: Progressing Goal: Discharge plan remains appropriate-arrangements made Outcome: Progressing  Problem: Discharge Progression Outcomes Goal: Discharge plan in place and appropriate Outcome: Progressing Goal: Pain controlled with appropriate interventions Outcome: Progressing Goal: Hemodynamically stable Outcome: Progressing Goal: Tolerating diet Outcome: Progressing Goal: Activity appropriate for discharge plan Outcome: Progressing

## 2014-04-06 NOTE — Op Note (Signed)
[facility] [address] [place], [id number]   COLONOSCOPY PROCEDURE REPORT     EXAM DATE: 04/06/2014  PATIENT NAME:      Luke Kim, Luke Kim           MR #:      [id number] BIRTHDATE:       1970/11/08      VISIT #:     [id number]  ATTENDING:     Vida RiggerMarc Commodore Bellew, MD     STATUS:     inpatient REFERRING MD: ASA CLASS:        Class II  INDICATIONS:  The patient is a 43 yr old male here for a colonoscopy due to hematochezia. PROCEDURE PERFORMED:     Colonoscopy, diagnostic MEDICATIONS:     Propofol 700 mg IV and Versed 2 mg IV ESTIMATED BLOOD LOSS:     None  CONSENT: The patient understands the risks and benefits of the procedure and understands that these risks include, but are not limited to: sedation, allergic reaction, infection, perforation and/or bleeding. Alternative means of evaluation and treatment include, among others: physical exam, x-rays, and/or surgical intervention. The patient elects to proceed with this endoscopic procedure.  DESCRIPTION OF PROCEDURE: During intra-op preparation period all mechanical & medical equipment was checked for proper function. Hand hygiene and appropriate measures for infection prevention was taken. After the risks, benefits and alternatives of the procedure were thoroughly explained, Informed consent was verified, confirmed and timeout was successfully executed by the treatment team. A digital exam revealed internal hemorrhoids and revealed external hemorrhoids. The Pentax Adult Colon [id number] endoscope was introduced through the anus and advanced to the terminal ileum which was intubated for a short distance. No adverse events experienced. The prep was adequate.. The instrument was then slowly withdrawn as the colon was fully examined. the findings are recorded below and there is no signs of bleeding and the patient tolerated the procedure well      Retroflexed views revealed internal Grade I hemorrhoids7  The  scope was then completely withdrawn from the patient and the procedure terminated.  WITHDRAWAL TIME: 17 minutes    ADVERSE EVENTS:      There were no immediate complications.  IMPRESSIONS:     #1 external greater than internal hemorrhoids 2. Otherwise within normal limits to the terminal ileum without any signs of bleeding  RECOMMENDATIONS:     continue workup with a endoscopy and see a surgeon as an outpatient regarding his hemorrhoids and follow-up in the office to recheck guaiacs and CBC in 1 month while using iron RECALL:     repeat colonoscopy when necessary or 10 years   Vida RiggerMarc Dov Dill, MD eSigned:  Vida RiggerMarc Nicholle Falzon, MD 04/06/2014 4:55 PM   cc:  CPT CODES: ICD CODES:  The ICD and CPT codes recommended by this software are interpretations from the data that the clinical staff has captured with the software.  The verification of the translation of this report to the ICD and CPT codes and modifiers is the sole responsibility of the health care institution and practicing physician where this report was generated.  [employer] will not be held responsible for the validity of the ICD and CPT codes included on this report.  AMA assumes no liability for data contained or not contained herein. CPT is a Publishing rights managerregistered trademark of the [employer].   PATIENT NAME:  Luke Kim, Luke Kim MR#: [id number]

## 2014-04-06 NOTE — Anesthesia Preprocedure Evaluation (Addendum)
Anesthesia Evaluation  Patient identified by MRN, date of birth, ID band Patient awake    Reviewed: Allergy & Precautions, H&P , NPO status , Patient's Chart, lab work & pertinent test results  History of Anesthesia Complications Negative for: history of anesthetic complications  Airway Mallampati: I  TM Distance: >3 FB Neck ROM: Full    Dental  (+) Teeth Intact, Dental Advisory Given   Pulmonary neg pulmonary ROS,  breath sounds clear to auscultation        Cardiovascular hypertension, Pt. on medications Rhythm:Regular Rate:Normal     Neuro/Psych Anxiety negative neurological ROS     GI/Hepatic Elevated LFTs: uses steroids Presumed GI bleed Does use steroids   Endo/Other  Morbid obesity  Renal/GU negative Renal ROS     Musculoskeletal   Abdominal (+) + obese,   Peds  Hematology  (+) Blood dyscrasia (Hb 9.2), ,   Anesthesia Other Findings   Reproductive/Obstetrics                            Anesthesia Physical Anesthesia Plan  ASA: II  Anesthesia Plan: MAC   Post-op Pain Management:    Induction: Intravenous  Airway Management Planned: Nasal Cannula, Simple Face Mask and Natural Airway  Additional Equipment:   Intra-op Plan:   Post-operative Plan:   Informed Consent: I have reviewed the patients History and Physical, chart, labs and discussed the procedure including the risks, benefits and alternatives for the proposed anesthesia with the patient or authorized representative who has indicated his/her understanding and acceptance.   Dental advisory given  Plan Discussed with: Surgeon and CRNA  Anesthesia Plan Comments: (Plan routine monitors, MAC)        Anesthesia Quick Evaluation

## 2014-04-06 NOTE — Progress Notes (Signed)
[facility] PROGRESS NOTE  Luke MachoLuke Jech ZOX:[id number] DOB: 08-31-1970 DOA: 04/04/2014 PCP: Thora LanceEHINGER,ROBERT R, MD  Assessment/Plan: 1. Microcytic anemia likely from LGI bleeding 1. No evidence of recurrent bleed since admission 2. Hgb rose nicely from 5 on admit to 9gm after 2 units PRBC's 3. Pt is iron deficient with iron of <10 4. Pt is s/p flex sig in 2012 by Dr. Bosie ClosSchooler, with findings of hemorrhoids per report 5. Consulted GI. Pt is s/p colonoscopy revealing hemorrhoids. EGD showing small hiatal hernia. GI recs for outpatient surgical follow up regarding hemorrhoids 2. Mental status change 1. Much improved 2. Question sedating meds taken the night prior to admission 3. Hx hemorrhoids 1. Previously treated in 2012 2. Question if related to above issue 4. DVT prophylaxis 1. SCD's 5. HTN 1. Stable 2. Monitor 6. ARF 1. Resolved with PRBC transfusion  Code Status: Full Family Communication: Pt in room Disposition Plan: pending   Consultants:  GI  Procedures:    Antibiotics:  none (indicate start date, and stop date if known)  HPI/Subjective: Feels well. Eager to go home  Objective: Filed Vitals:   04/06/14 1138 04/06/14 1139 04/06/14 1510 04/06/14 1701  BP:  123/71 154/92 107/43  Pulse:  89 102 83  Temp: 98.1 F (36.7 C)   98.4 F (36.9 C)  TempSrc: Oral   Oral  Resp:  23 14   Height:      Weight:      SpO2:  100% 100% 99%    Intake/Output Summary (Last 24 hours) at 04/06/14 1821 Last data filed at 04/06/14 1707  Gross per 24 hour  Intake   3320 ml  Output    820 ml  Net   2500 ml   Filed Weights   04/04/14 1342 04/04/14 1747  Weight: 113.399 kg (250 lb) 111.6 kg (246 lb 0.5 oz)    Exam:   General:  Awake, in nad  Cardiovascular: regular, s1, s2  Respiratory: normal resp effort, no wheezing  Abdomen: soft,nondistended  Musculoskeletal: perfused, no clubbing   Data Reviewed: Basic Metabolic Panel:  Recent Labs Lab  04/04/14 1333 04/05/14 0235  NA 138 136*  K 5.2 4.3  CL 103 100  CO2 22 23  GLUCOSE 124* 88  BUN 26* 16  CREATININE 1.47* 1.13  CALCIUM 8.8 8.8   Liver Function Tests:  Recent Labs Lab 04/04/14 1333 04/05/14 0235  AST 38* 46*  ALT 41 39  ALKPHOS 68 72  BILITOT 0.2* 0.5  PROT 6.5 6.7  ALBUMIN 3.5 3.4*   No results for input(s): LIPASE, AMYLASE in the last 168 hours. No results for input(s): AMMONIA in the last 168 hours. CBC:  Recent Labs Lab 04/04/14 1333 04/04/14 1414 04/04/14 1629 04/05/14 0235 04/06/14 0932  WBC 9.5 8.4  --  8.2 5.7  NEUTROABS  --  7.3  --   --   --   HGB 5.2* 4.9* 5.2* 7.1* 9.2*  HCT 18.4* 17.4*  --  24.1* 32.0*  MCV 61.7* 61.9*  --  65.8* 65.8*  PLT 350 320  --  346 430*   Cardiac Enzymes: No results for input(s): CKTOTAL, CKMB, CKMBINDEX, TROPONINI in the last 168 hours. BNP (last 3 results) No results for input(s): PROBNP in the last 8760 hours. CBG:  Recent Labs Lab 04/04/14 1741  GLUCAP 122*    Recent Results (from the past 240 hour(s))  MRSA PCR Screening     Status: None   Collection Time: 04/04/14  5:55 PM  Result Value Ref Range Status   MRSA by PCR NEGATIVE NEGATIVE Final    Comment:        The GeneXpert MRSA Assay (FDA approved for NASAL specimens only), is one component of a comprehensive MRSA colonization surveillance program. It is not intended to diagnose MRSA infection nor to guide or monitor treatment for MRSA infections.      Studies: No results found.  Scheduled Meds: . ALPRAZolam  1 mg Oral BID  . amLODipine  10 mg Oral Daily  . Influenza vac split quadrivalent PF  0.5 mL Intramuscular Tomorrow-1000  . pantoprazole  40 mg Oral BID AC  . sodium chloride  3 mL Intravenous Q12H   Continuous Infusions: . sodium chloride 75 mL/hr at 04/06/14 1755    Principal Problem:   Hemorrhoids, internal, with bleeding Active Problems:   Severe anemia   Mental status change   Microcytic anemia    Anemia  Time spent: 35min  CHIU, STEPHEN K  [facility] Pager [phone]. If 7PM-7AM, please contact night-coverage at [email or web address], password [email or web address] 04/06/2014, 6:21 PM  LOS: 2 days

## 2014-04-06 NOTE — Anesthesia Postprocedure Evaluation (Signed)
  Anesthesia Post-op Note  Patient: Luke Kim  Procedure(s) Performed: Procedure(s): ESOPHAGOGASTRODUODENOSCOPY (EGD) WITH PROPOFOL (Left) COLONOSCOPY WITH PROPOFOL (Left)  Patient Location: Endoscopy Unit  Anesthesia Type:MAC  Level of Consciousness: awake, alert , oriented and patient cooperative  Airway and Oxygen Therapy: Patient Spontanous Breathing  Post-op Pain: none  Post-op Assessment: Post-op Vital signs reviewed, Patient's Cardiovascular Status Stable, Respiratory Function Stable, Patent Airway, No signs of Nausea or vomiting and Pain level controlled  Post-op Vital Signs: Reviewed and stable  Last Vitals:  Filed Vitals:   04/06/14 1701  BP: 107/43  Pulse: 83  Temp: 36.9 C  Resp:     Complications: No apparent anesthesia complications

## 2014-04-07 ENCOUNTER — Encounter (HOSPITAL_COMMUNITY): Payer: Self-pay | Admitting: Gastroenterology

## 2014-04-07 DIAGNOSIS — D62 Acute posthemorrhagic anemia: Secondary | ICD-10-CM

## 2014-04-07 LAB — CBC
HCT: 28.7 % — ABNORMAL LOW (ref 39.0–52.0)
Hemoglobin: 8.2 g/dL — ABNORMAL LOW (ref 13.0–17.0)
MCH: 18.9 pg — AB (ref 26.0–34.0)
MCHC: 28.6 g/dL — AB (ref 30.0–36.0)
MCV: 66.1 fL — AB (ref 78.0–100.0)
Platelets: 379 10*3/uL (ref 150–400)
RBC: 4.34 MIL/uL (ref 4.22–5.81)
RDW: 21.2 % — ABNORMAL HIGH (ref 11.5–15.5)
WBC: 5.9 10*3/uL (ref 4.0–10.5)

## 2014-04-07 MED ORDER — FERROUS SULFATE 325 (65 FE) MG PO TABS: 325.0000 mg | ORAL_TABLET | Freq: Every day | ORAL | Status: AC

## 2014-04-07 MED ORDER — PANTOPRAZOLE SODIUM 40 MG PO TBEC: 40.0000 mg | DELAYED_RELEASE_TABLET | Freq: Two times a day (BID) | ORAL | Status: DC

## 2014-04-07 MED ORDER — DOCUSATE SODIUM 100 MG PO CAPS: 100.0000 mg | ORAL_CAPSULE | Freq: Two times a day (BID) | ORAL | Status: DC

## 2014-04-07 MED ORDER — PANTOPRAZOLE SODIUM 40 MG PO TBEC: 40.0000 mg | DELAYED_RELEASE_TABLET | Freq: Every day | ORAL | Status: DC

## 2014-04-07 NOTE — Progress Notes (Signed)
Luke Kim 11:36 AM  Subjective: Patient doing well without colonoscopy or endoscopy problems and wants to go home and no new complaints and case discussed with his primary team and his findings were reviewed with the patient  Objective: Vital signs stable afebrile no acute distress abdomen is soft nontender hemoglobin slight decrease without signs of obvious bleeding  Assessment: Iron deficiency questionably etiology in patient with reflux and hemorrhoids  Plan: Okay with me to go home on iron supplements and pump inhibitors and call me the end of the week for biopsy results and follow-up in a few weeks to recheck CBC and we'll set up surgical consultation for hemorrhoids as well and avoid aspirin and nonsteroidals long-term  Luke Kim E

## 2014-04-07 NOTE — Discharge Summary (Signed)
Physician Discharge Summary  Luke Kim [id number] DOB: Nov 06, 1970 DOA: 04/04/2014  PCP: Thora LanceEHINGER,ROBERT R, MD  Admit date: 04/04/2014 Discharge date: 04/07/2014  Time spent: 35 minutes  Recommendations for Outpatient Follow-up:  1. Recommend outpatient referral to General Surgery to evaluate hemorrhoids 2. Follow up with [facility] in one month to recheck guaiac and to check CBC  Discharge Diagnoses:  Principal Problem:   Hemorrhoids, internal, with bleeding Active Problems:   Severe anemia   Mental status change   Microcytic anemia   Anemia   Discharge Condition: Improved  Diet recommendation: Regular  Filed Weights   04/04/14 1342 04/04/14 1747  Weight: 113.399 kg (250 lb) 111.6 kg (246 lb 0.5 oz)    History of present illness:  Please see admit h and p from 11/14 for details. Briefly, pt initially presented with ams , found to be profoundly anemic during his initial work up in the ED. The patient was admitted for further work up.  Hospital Course: 1. Acute blood loss anemia with iron deficiency likely from LGI bleeding 1. No evidence of recurrent bleed since admission 2. Hgb rose nicely from 5 on admit to 9gm after 2 units PRBC's 3. Pt is iron deficient with iron of <10 4. Pt is s/p flex sig in 2012 by Dr. Bosie ClosSchooler, with findings of hemorrhoids per previous report 5. Consulted GI. Pt underwent colonoscopy revealing hemorrhoids. EGD showing small hiatal hernia. GI recs for outpatient surgical follow up regarding hemorrhoids and daily PPI 2. Mental status change 1. Much improved and now resolved 2. Question sedating meds taken the night prior to admission 3. Would avoid further sedating meds if possible 3. Hx hemorrhoids 1. Previously treated in 2012 2. Likely related to above issue 4. DVT prophylaxis 1. SCD's 5. HTN 1. Stable 2. Monitor 6. ARF 1. Resolved with PRBC transfusion  Procedures: EGD and Colonoscopy 11/16  Consultations:  GI  Discharge  Exam: Filed Vitals:   04/06/14 1942 04/06/14 2331 04/07/14 0255 04/07/14 0708  BP: 125/62 130/66 114/60 122/68  Pulse: 101 97 87 87  Temp: 98.7 F (37.1 C) 98.6 F (37 C) 98.6 F (37 C) 98.6 F (37 C)  TempSrc: Oral Oral Oral Oral  Resp: 21 29 13 19   Height:      Weight:      SpO2: 100% 100% 99% 100%    General: Awake, in nad Cardiovascular: regular, s1, s2 Respiratory: normal resp effort, no wheezing  Discharge Instructions     Medication List    TAKE these medications        ALPRAZolam 1 MG tablet  Commonly known as:  XANAX  Take 1 mg by mouth 2 (two) times daily.     amLODipine 10 MG tablet  Commonly known as:  NORVASC  Take 10 mg by mouth daily.     benazepril 20 MG tablet  Commonly known as:  LOTENSIN  Take 20 mg by mouth 2 (two) times daily.     diphenhydrAMINE 25 MG tablet  Commonly known as:  SOMINEX  Take 25 mg by mouth at bedtime as needed.     docusate sodium 100 MG capsule  Commonly known as:  COLACE  Take 1 capsule (100 mg total) by mouth 2 (two) times daily.     ferrous sulfate 325 (65 FE) MG tablet  Take 1 tablet (325 mg total) by mouth daily with breakfast.     hydrOXYzine 50 MG tablet  Commonly known as:  ATARAX/VISTARIL  Take 50-100 mg by mouth  at bedtime as needed.     methylcellulose oral powder  Take 1 packet by mouth daily.     Milk Thistle 140 MG Caps  Take 140 mg by mouth daily.     pantoprazole 40 MG tablet  Commonly known as:  PROTONIX  Take 1 tablet (40 mg total) by mouth daily.     sertraline 25 MG tablet  Commonly known as:  ZOLOFT  Take 25 mg by mouth daily.     vitamin B-12 100 MCG tablet  Commonly known as:  CYANOCOBALAMIN  Take 50 mcg by mouth daily.       No Known Allergies Follow-up Information    Follow up with Thora Lance, MD. Schedule an appointment as soon as possible for a visit in 1 week.   Specialty:  Family Medicine   Contact information:   [address], [address] [place]  [address] [phone]        The results of significant diagnostics from this hospitalization (including imaging, microbiology, ancillary and laboratory) are listed below for reference.    Significant Diagnostic Studies: Dg Chest 2 View  04/04/2014   CLINICAL DATA:  Cough.  ICD10: R 0 5  EXAM: CHEST  2 VIEW  COMPARISON:  None.  FINDINGS: Midline trachea. Mild cardiomegaly. Mediastinal contours otherwise within normal limits. No pleural effusion or pneumothorax. No lobar consolidation. Minimally low lung volumes on the frontal radiograph. The pulmonary interstitium is mildly prominent.  IMPRESSION: Mild cardiomegaly. Apparent pulmonary interstitial prominence is favored to be due to mildly low lung volumes. Mild pulmonary venous congestion could look similar.   Electronically Signed   By: Jeronimo Greaves M.D.   On: 04/04/2014 15:57    Microbiology: Recent Results (from the past 240 hour(s))  MRSA PCR Screening     Status: None   Collection Time: 04/04/14  5:55 PM  Result Value Ref Range Status   MRSA by PCR NEGATIVE NEGATIVE Final    Comment:        The GeneXpert MRSA Assay (FDA approved for NASAL specimens only), is one component of a comprehensive MRSA colonization surveillance program. It is not intended to diagnose MRSA infection nor to guide or monitor treatment for MRSA infections.      Labs: Basic Metabolic Panel:  Recent Labs Lab 04/04/14 1333 04/05/14 0235  NA 138 136*  K 5.2 4.3  CL 103 100  CO2 22 23  GLUCOSE 124* 88  BUN 26* 16  CREATININE 1.47* 1.13  CALCIUM 8.8 8.8   Liver Function Tests:  Recent Labs Lab 04/04/14 1333 04/05/14 0235  AST 38* 46*  ALT 41 39  ALKPHOS 68 72  BILITOT 0.2* 0.5  PROT 6.5 6.7  ALBUMIN 3.5 3.4*   No results for input(s): LIPASE, AMYLASE in the last 168 hours. No results for input(s): AMMONIA in the last 168 hours. CBC:  Recent Labs Lab 04/04/14 1333 04/04/14 1414 04/04/14 1629 04/05/14 0235 04/06/14 0932  04/07/14 0322  WBC 9.5 8.4  --  8.2 5.7 5.9  NEUTROABS  --  7.3  --   --   --   --   HGB 5.2* 4.9* 5.2* 7.1* 9.2* 8.2*  HCT 18.4* 17.4*  --  24.1* 32.0* 28.7*  MCV 61.7* 61.9*  --  65.8* 65.8* 66.1*  PLT 350 320  --  346 430* 379   Cardiac Enzymes: No results for input(s): CKTOTAL, CKMB, CKMBINDEX, TROPONINI in the last 168 hours. BNP: BNP (last 3 results) No results for  input(s): PROBNP in the last 8760 hours. CBG:  Recent Labs Lab 04/04/14 1741  GLUCAP 122*       Signed:  CHIU, STEPHEN K  [facility] 04/07/2014, 10:42 AM

## 2014-04-07 NOTE — Progress Notes (Signed)
CARE MANAGEMENT NOTE 04/07/2014  Patient:  Luke Kim,Luke Kim   Account Number:  [id number]  Date Initiated:  04/07/2014  Documentation initiated by:  [facility]Morio Widen  Subjective/Objective Assessment:   anemia     Action/Plan:   home with wife   Anticipated DC Date:  04/07/2014   Anticipated DC Plan:  HOME/SELF CARE      DC Planning Services  CM consult      Choice offered to / List presented to:             Status of service:  Completed, signed off Medicare Important Message given?   (If response is "NO", the following Medicare IM given date fields will be blank) Date Medicare IM given:   Medicare IM given by:   Date Additional Medicare IM given:   Additional Medicare IM given by:    Discharge Disposition:  HOME/SELF CARE  Per UR Regulation:  Reviewed for med. necessity/level of care/duration of stay  If discussed at Long Length of Stay Meetings, dates discussed:    Comments:  04/07/2014 1100 Chart reviewed. No NCM needs identified. Luke DonningAlesia Maysin Carstens RN CCM Case Mgmt

## 2014-04-07 NOTE — Discharge Instructions (Signed)
Iron Deficiency Anemia Anemia is a condition in which there are less red blood cells or hemoglobin in the blood than normal. Hemoglobin is the part of red blood cells that carries oxygen. Iron deficiency anemia is anemia caused by too little iron. It is the most common type of anemia. It may leave you tired and short of breath. CAUSES   Lack of iron in the diet.  Poor absorption of iron, as seen with intestinal disorders.  Intestinal bleeding.  Heavy periods. SIGNS AND SYMPTOMS  Mild anemia may not be noticeable. Symptoms may include:  Fatigue.  Headache.  Pale skin.  Weakness.  Tiredness.  Shortness of breath.  Dizziness.  Cold hands and feet.  Fast or irregular heartbeat. DIAGNOSIS  Diagnosis requires a thorough evaluation and physical exam by your health care provider. Blood tests are generally used to confirm iron deficiency anemia. Additional tests may be done to find the underlying cause of your anemia. These may include:  Testing for blood in the stool (fecal occult blood test).  A procedure to see inside the colon and rectum (colonoscopy).  A procedure to see inside the esophagus and stomach (endoscopy). TREATMENT  Iron deficiency anemia is treated by correcting the cause of the deficiency. Treatment may involve:  Adding iron-rich foods to your diet.  Taking iron supplements. Pregnant or breastfeeding women need to take extra iron because their normal diet usually does not provide the required amount.  Taking vitamins. Vitamin C improves the absorption of iron. Your health care provider may recommend that you take your iron tablets with a glass of orange juice or vitamin C supplement.  Medicines to make heavy menstrual flow lighter.  Surgery. HOME CARE INSTRUCTIONS   Take iron as directed by your health care provider.  If you cannot tolerate taking iron supplements by mouth, talk to your health care provider about taking them through a vein  (intravenously) or an injection into a muscle.  For the best iron absorption, iron supplements should be taken on an empty stomach. If you cannot tolerate them on an empty stomach, you may need to take them with food.  Do not drink milk or take antacids at the same time as your iron supplements. Milk and antacids may interfere with the absorption of iron.  Iron supplements can cause constipation. Make sure to include fiber in your diet to prevent constipation. A stool softener may also be recommended.  Take vitamins as directed by your health care provider.  Eat a diet rich in iron. Foods high in iron include liver, lean beef, whole-grain bread, eggs, dried fruit, and dark green leafy vegetables. SEEK IMMEDIATE MEDICAL CARE IF:   You faint. If this happens, do not drive. Call your local emergency services (911 in [place] if no other help is available.  You have chest pain.  You feel nauseous or vomit.  You have severe or increased shortness of breath with activity.  You feel weak.  You have a rapid heartbeat.  You have unexplained sweating.  You become light-headed when getting up from a chair or bed. MAKE SURE YOU:   Understand these instructions.  Will watch your condition.  Will get help right away if you are not doing well or get worse. Document Released: 05/05/2000 Document Revised: 05/13/2013 Document Reviewed: 01/13/2013 Exit[employer] Patient Information 2015 [employer]. This information is not intended to replace advice given to you by your health care provider. Make sure you discuss any questions you have with your health care provider.  

## 2014-04-07 NOTE — Progress Notes (Signed)
Reviewed patient discharge instructions with patient and wife.  Prescriptions given.  Patient is stable at discharge.

## 2014-04-29 ENCOUNTER — Other Ambulatory Visit (INDEPENDENT_AMBULATORY_CARE_PROVIDER_SITE_OTHER): Payer: Self-pay | Admitting: Surgery

## 2014-04-29 NOTE — H&P (Signed)
Luke Kim 04/29/2014 3:50 PM Location: [facility] Patient #: [id number] DOB: 1971-04-08 Married / Language: [place] / Race: Undefined Male History of Present Illness [name] MD; 04/29/2014 6:01 PM) Patient words: hems.  The patient is a 43 year old male who presents with hemorrhoids. Patient sent to me by his gastroenterologist, [name], and primary care physician, [name], for concerns of significant GI bleeding and internal hemorrhoids. Active anxious male. Weightlifter. History of hemorrhoids with bleeding. I saw him over 3 years ago. Offered injection. He Selle that helped for a little while but then had recurrent bleeding. Did not want to see a doctor about it. Then developed episode of severe bleeding and severe anemia. Had endoscopy done. Some evidence of esophagitis and duodenitis. Stop nonsteroidals. Hemorrhoids most likely suspected as bleeding source. Eventually convinced to return to see surgery to see if something can be done for the hemorrhoids. He struggles with pain with bowel movements. He feels something popping out. Usually pops back in. He often feels like he cannot empty normally and never fully complete defecation. Usually has 2-4 bowel movements a day. He used to be on fiber a lot with stool softeners but was taking extra doses since he wanted to avoid constipation. Had issues with diarrhea on it. Finally convinced to back off. Colonoscopy ruled out colitis. He comes today with his wife. Other Problems [name], CMA; 04/29/2014 3:50 PM) Anxiety Disorder Gastroesophageal Reflux Disease Hemorrhoids High blood pressure  Past Surgical History [name], CMA; 04/29/2014 3:50 PM) No pertinent past surgical history  Diagnostic Studies History [name], CMA; 04/29/2014 3:50 PM) Colonoscopy within last year  Allergies [name], CMA; 04/29/2014 3:52 PM) No Known Drug Allergies 04/29/2014  Medication History  ([name], CMA; 04/29/2014 3:52 PM) ALPRAZolam (1MG  Tablet, Oral) Active. Zolpidem Tartrate (10MG  Tablet, Oral) Active. AmLODIPine Besylate (10MG  Tablet, Oral) Active. Docusate Sodium (100MG  Capsule, Oral) Active. HydrOXYzine HCl (50MG  Tablet, Oral) Active. Pantoprazole Sodium (40MG  Tablet DR, Oral) Active. Benazepril HCl (20MG  Tablet, Oral) Active.  Social History [name], CMA; 04/29/2014 3:50 PM) Alcohol use Occasional alcohol use. Caffeine use Coffee. Tobacco use Never smoker.  Family History [name], CMA; 04/29/2014 3:50 PM) Depression Mother. Hypertension Father, Mother. Ischemic Bowel Disease Brother, Father.     Review of Systems [name] CMA; 04/29/2014 3:50 PM) General Not Present- Appetite Loss, Chills, Fatigue, Fever, Night Sweats, Weight Gain and Weight Loss. Skin Not Present- Change in Wart/Mole, Dryness, Hives, Jaundice, New Lesions, Non-Healing Wounds, Rash and Ulcer. HEENT Not Present- Earache, Hearing Loss, Hoarseness, Nose Bleed, Oral Ulcers, Ringing in the Ears, Seasonal Allergies, Sinus Pain, Sore Throat, Visual Disturbances, Wears glasses/contact lenses and Yellow Eyes. Respiratory Not Present- Bloody sputum, Chronic Cough, Difficulty Breathing, Snoring and Wheezing. Breast Not Present- Breast Mass, Breast Pain, Nipple Discharge and Skin Changes. Cardiovascular Not Present- Chest Pain, Difficulty Breathing Lying Down, Leg Cramps, Palpitations, Rapid Heart Rate, Shortness of Breath and Swelling of Extremities. Gastrointestinal Present- Bloody Stool and Hemorrhoids. Not Present- Abdominal Pain, Bloating, Change in Bowel Habits, Chronic diarrhea, Constipation, Difficulty Swallowing, Excessive gas, Gets full quickly at meals, Indigestion, Nausea, Rectal Pain and Vomiting. Male Genitourinary Not Present- Blood in Urine, Change in Urinary Stream, Frequency, Impotence, Nocturia, Painful Urination, Urgency and Urine Leakage. Musculoskeletal Not  Present- Back Pain, Joint Pain, Joint Stiffness, Muscle Pain, Muscle Weakness and Swelling of Extremities. Neurological Not Present- Decreased Memory, Fainting, Headaches, Numbness, Seizures, Tingling, Tremor, Trouble walking and Weakness. Psychiatric Present- Anxiety. Not Present- Bipolar, Change in  Sleep Pattern, Depression, Fearful and Frequent crying. Endocrine Not Present- Cold Intolerance, Excessive Hunger, Hair Changes, Heat Intolerance, Hot flashes and New Diabetes. Hematology Not Present- Easy Bruising, Excessive bleeding, Gland problems, HIV and Persistent Infections.  Vitals ([name] CMA; 04/29/2014 3:52 PM) 04/29/2014 3:51 PM Weight: 256 lb Height: 71in Body Surface Area: 2.41 m Body Mass Index: 35.7 kg/m Temp.: 97.52F(Temporal)  Pulse: 73 (Regular)  BP: 152/84 (Sitting, Left Arm, Standard)     Physical Exam [name](Mikylah Ackroyd C. Swati Granberry MD; 04/29/2014 6:01 PM)  General Mental Status-Alert. General Appearance-Not in acute distress, Not Sickly. Orientation-Oriented X3. Hydration-Well hydrated. Voice-Normal.  Integumentary Global Assessment Upon inspection and palpation of skin surfaces of the - Axillae: non-tender, no inflammation or ulceration, no drainage. and Distribution of scalp and body hair is normal. General Characteristics Temperature - normal warmth is noted.  Head and Neck Head-normocephalic, atraumatic with no lesions or palpable masses. Face Global Assessment - atraumatic, no absence of expression. Neck Global Assessment - no abnormal movements, no bruit auscultated on the right, no bruit auscultated on the left, no decreased range of motion, non-tender. Trachea-midline. Thyroid Gland Characteristics - non-tender.  Eye Eyeball - Left-Extraocular movements intact, No Nystagmus. Eyeball - Right-Extraocular movements intact, No Nystagmus. Cornea - Left-No Hazy. Cornea - Right-No Hazy. Sclera/Conjunctiva - Left-No scleral  icterus, No Discharge. Sclera/Conjunctiva - Right-No scleral icterus, No Discharge. Pupil - Left-Direct reaction to light normal. Pupil - Right-Direct reaction to light normal.  ENMT Ears Pinna - Left - no drainage observed, no generalized tenderness observed. Right - no drainage observed, no generalized tenderness observed. Nose and Sinuses External Inspection of the Nose - no destructive lesion observed. Inspection of the nares - Left - quiet respiration. Right - quiet respiration. Mouth and Throat Lips - Upper Lip - no fissures observed, no pallor noted. Lower Lip - no fissures observed, no pallor noted. Nasopharynx - no discharge present. Oral Cavity/Oropharynx - Tongue - no dryness observed. Oral Mucosa - no cyanosis observed. Hypopharynx - no evidence of airway distress observed.  Chest and Lung Exam Inspection Movements - Normal and Symmetrical. Accessory muscles - No use of accessory muscles in breathing. Palpation Palpation of the chest reveals - Non-tender. Auscultation Breath sounds - Normal and Clear.  Cardiovascular Auscultation Rhythm - Regular. Murmurs & Other Heart Sounds - Auscultation of the heart reveals - No Murmurs and No Systolic Clicks.  Abdomen Inspection Inspection of the abdomen reveals - No Visible peristalsis and No Abnormal pulsations. Umbilicus - No Bleeding, No Urine drainage. Palpation/Percussion Palpation and Percussion of the abdomen reveal - Soft, Non Tender, No Rebound tenderness, No Rigidity (guarding) and No Cutaneous hyperesthesia.  Male Genitourinary Sexual Maturity Tanner 5 - Adult hair pattern and Adult penile size and shape. Note: Circumcised normal external male genitalia. No hernias. No warts.   Rectal Note: Perianal skin and clean. Right posterior internal hemorrhoid partially prolapsed in. Prolapses more with Valsalva. Inflamed left lateral and right anterior internal hemorrhoids as well. Somewhat increased sphincter  tone.Marland Kitchen. He tolerates digital and anoscopic examination but very anxious. No fissure. No fistula. No pilonidal disease. No warts. No proctitis. Prostate smooth and not enlarged.   Peripheral Vascular Upper Extremity Inspection - Left - No Cyanotic nailbeds, Not Ischemic. Right - No Cyanotic nailbeds, Not Ischemic.  Neurologic Neurologic evaluation reveals -normal attention span and ability to concentrate, able to name objects and repeat phrases. Appropriate fund of knowledge , normal sensation and normal coordination. Mental Status Affect - not angry, not paranoid. Cranial Nerves-Normal Bilaterally.  Gait-Normal.  Neuropsychiatric Mental status exam performed with findings of-able to articulate well with normal speech/language, rate, volume and coherence, thought content normal with ability to perform basic computations and apply abstract reasoning and no evidence of hallucinations, delusions, obsessions or homicidal/suicidal ideation. Note: Anxious but consolable.   Musculoskeletal Global Assessment Spine, Ribs and Pelvis - no instability, subluxation or laxity. Right Upper Extremity - no instability, subluxation or laxity.  Lymphatic Head & Neck  General Head & Neck Lymphatics: Bilateral - Description - No Localized lymphadenopathy. Axillary  General Axillary Region: Bilateral - Description - No Localized lymphadenopathy. Femoral & Inguinal  Generalized Femoral & Inguinal Lymphatics: Left - Description - No Localized lymphadenopathy. Right - Description - No Localized lymphadenopathy.    Assessment & Plan [name](Trysten Bernard C. Jahden Schara MD; 04/29/2014 4:23 PM)  PROLAPSED INTERNAL HEMORRHOIDS, GRADE 3 (455.2  K64.8) Impression: Third degree internal hemorrhoids partially prolapsing. Significant bleeding requiring transfusion without major digestive tract etiology aside from mild esophagitis. Very sensitive and uncomfortable.  He can benefit from a series of banding [place]  calm hemorrhoid stay on although not as effective in bleeding hemorrhoids. Other option as hemorrhoidal ligation with THD. He feels he cannot tolerate banding and wishes to have surgery. I cautioned him increased postprocedural/  Operative pain. However, wall for more definitive treatment for the problem. Discuss with his wife as well. Both agree:  The anatomy & physiology of the anorectal region was discussed. The pathophysiology of hemorrhoids and differential diagnosis was discussed. Natural history risks without surgery was discussed. I stressed the importance of a bowel regimen to have daily soft bowel movements to minimize progression of disease. Interventions such as sclerotherapy & banding were discussed.  The patient's symptoms are not adequately controlled by medicines and other non-operative treatments. I feel the risks & problems of no surgery outweigh the operative risks; therefore, I recommended surgery to treat the hemorrhoids by ligation, pexy, and possible resection.  Risks such as bleeding, infection, urinary difficulties, need for further treatment, heart attack, death, and other risks were discussed. I noted a good likelihood this will help address the problem. Goals of post-operative recovery were discussed as well. Possibility that this will not correct all symptoms was explained. Post-operative pain, bleeding, constipation, and other problems after surgery were discussed. We will work to minimize complications. Educational handouts further explaining the pathology, treatment options, and bowel regimen were given as well. Questions were answered. The patient expresses understanding & wishes to proceed with surgery.  Current Plans Schedule for Surgery Pt Education - [facility] Hemorrhoids (Agape Hardiman) Pt Education - [facility] Good Bowel Health (Evin Chirco) Pt Education - [facility] Rectal Surgery HCI (Jacaden Forbush): discussed with patient and provided information. The anatomy & physiology of the anorectal region was  discussed. The pathophysiology of hemorrhoids and differential diagnosis was discussed. Natural history risks without surgery was discussed. I stressed the importance of a bowel regimen to have daily soft bowel movements to minimize progression of disease. Interventions such as sclerotherapy & banding were discussed.  The patient's symptoms are not adequately controlled by medicines and other non-operative treatments. I feel the risks & problems of no surgery outweigh the operative risks; therefore, I recommended surgery to treat the hemorrhoids by ligation, pexy, and possible resection.  Risks such as bleeding, infection, urinary difficulties, need for further treatment, heart attack, death, and other risks were discussed. I noted a good likelihood this will help address the problem. Goals of post-operative recovery were discussed as well. Possibility that this will not correct all symptoms  was explained. Post-operative pain, bleeding, constipation, and other problems after surgery were discussed. We will work to minimize complications. Educational handouts further explaining the pathology, treatment options, and bowel regimen were given as well. Questions were answered. The patient expresses understanding & wishes to proceed with surgery. ANOSCOPY, DIAGNOSTIC (16[address])  [name], M.D., F.A.C.S. Gastrointestinal and Minimally Invasive Surgery [facility] [address], [address] [place], [place] [address] [phone] Main / Paging

## 2014-05-27 ENCOUNTER — Other Ambulatory Visit (INDEPENDENT_AMBULATORY_CARE_PROVIDER_SITE_OTHER): Payer: Self-pay | Admitting: Surgery

## 2014-05-27 NOTE — H&P (Signed)
Luke Kim 04/29/2014 3:50 PM Location: [facility] Patient #: [id number] DOB: [birth year] Married / Language: [place] / Race: Undefined Male  History of Present Illness Luke Sportsman MD; 04/29/2014 6:01 PM) Patient words: hems.  The patient is a 44 year old male who presents with hemorrhoids. Patient sent to me by his gastroenterologist, [name], and primary care physician, [name], for concerns of significant GI bleeding and internal hemorrhoids. Active anxious male. Weightlifter. History of hemorrhoids with bleeding. I saw him over 3 years ago. Offered injection. He Selle that helped for a little while but then had recurrent bleeding. Did not want to see a doctor about it. Then developed episode of severe bleeding and severe anemia. Had endoscopy done. Some evidence of esophagitis and duodenitis. Stop nonsteroidals. Hemorrhoids most likely suspected as bleeding source. Eventually convinced to return to see surgery to see if something can be done for the hemorrhoids. He struggles with pain with bowel movements. He feels something popping out. Usually pops back in. He often feels like he cannot empty normally and never fully complete defecation. Usually has 2-4 bowel movements a day. He used to be on fiber a lot with stool softeners but was taking extra doses since he wanted to avoid constipation. Had issues with diarrhea on it. Finally convinced to back off. Colonoscopy ruled out colitis. He comes today with his wife.   Other Problems Luke Kim, CMA; 04/29/2014 3:50 PM) Anxiety Disorder Gastroesophageal Reflux Disease Hemorrhoids High blood pressure  Past Surgical History Luke Kim, CMA; 04/29/2014 3:50 PM) No pertinent past surgical history  Diagnostic Studies History Luke Kim, CMA; 04/29/2014 3:50 PM) Colonoscopy within last year  Allergies Luke Kim, CMA; 04/29/2014 3:52 PM) No Known Drug Allergies12/01/2014  Medication  History (Sonya Kim, CMA; 04/29/2014 3:52 PM) ALPRAZolam (1MG  Tablet, Oral) Active. Zolpidem Tartrate (10MG  Tablet, Oral) Active. AmLODIPine Besylate (10MG  Tablet, Oral) Active. Docusate Sodium (100MG  Capsule, Oral) Active. HydrOXYzine HCl (50MG  Tablet, Oral) Active. Pantoprazole Sodium (40MG  Tablet DR, Oral) Active. Benazepril HCl (20MG  Tablet, Oral) Active.  Social History Luke Kim, CMA; 04/29/2014 3:50 PM) Alcohol use Occasional alcohol use. Caffeine use Coffee. Tobacco use Never smoker.  Family History Luke Kim, CMA; 04/29/2014 3:50 PM) Depression Mother. Hypertension Father, Mother. Ischemic Bowel Disease Brother, Father.  Review of Systems Luke Kim CMA; 04/29/2014 3:50 PM) General Not Present- Appetite Loss, Chills, Fatigue, Fever, Night Sweats, Weight Gain and Weight Loss. Skin Not Present- Change in Wart/Mole, Dryness, Hives, Jaundice, New Lesions, Non-Healing Wounds, Rash and Ulcer. HEENT Not Present- Earache, Hearing Loss, Hoarseness, Nose Bleed, Oral Ulcers, Ringing in the Ears, Seasonal Allergies, Sinus Pain, Sore Throat, Visual Disturbances, Wears glasses/contact lenses and Yellow Eyes. Respiratory Not Present- Bloody sputum, Chronic Cough, Difficulty Breathing, Snoring and Wheezing. Breast Not Present- Breast Mass, Breast Pain, Nipple Discharge and Skin Changes. Cardiovascular Not Present- Chest Pain, Difficulty Breathing Lying Down, Leg Cramps, Palpitations, Rapid Heart Rate, Shortness of Breath and Swelling of Extremities. Gastrointestinal Present- Bloody Stool and Hemorrhoids. Not Present- Abdominal Pain, Bloating, Change in Bowel Habits, Chronic diarrhea, Constipation, Difficulty Swallowing, Excessive gas, Gets full quickly at meals, Indigestion, Nausea, Rectal Pain and Vomiting. Male Genitourinary Not Present- Blood in Urine, Change in Urinary Stream, Frequency, Impotence, Nocturia, Painful Urination, Urgency and Urine Leakage. Musculoskeletal Not  Present- Back Pain, Joint Pain, Joint Stiffness, Muscle Pain, Muscle Weakness and Swelling of Extremities. Neurological Not Present- Decreased Memory, Fainting, Headaches, Numbness, Seizures, Tingling, Tremor, Trouble walking and Weakness. Psychiatric Present- Anxiety. Not Present- Bipolar, Change in Sleep  Pattern, Depression, Fearful and Frequent crying. Endocrine Not Present- Cold Intolerance, Excessive Hunger, Hair Changes, Heat Intolerance, Hot flashes and New Diabetes. Hematology Not Present- Easy Bruising, Excessive bleeding, Gland problems, HIV and Persistent Infections.   Vitals (Sonya Kim CMA; 04/29/2014 3:52 PM) 04/29/2014 3:51 PM Weight: 256 lb Height: 71in Body Surface Area: 2.41 m Body Mass Index: 35.7 kg/m Temp.: 97.50F(Temporal)  Pulse: 73 (Regular)  BP: 152/84 (Sitting, Left Arm, Standard)    Physical Exam Luke Kim(Xela Oregel C. Felipa Laroche MD; 04/29/2014 6:01 PM) General Mental Status-Alert. General Appearance-Not in acute distress, Not Sickly. Orientation-Oriented X3. Hydration-Well hydrated. Voice-Normal.  Integumentary Global Assessment Upon inspection and palpation of skin surfaces of the - Axillae: non-tender, no inflammation or ulceration, no drainage. and Distribution of scalp and body hair is normal. General Characteristics Temperature - normal warmth is noted.  Head and Neck Head-normocephalic, atraumatic with no lesions or palpable masses. Face Global Assessment - atraumatic, no absence of expression. Neck Global Assessment - no abnormal movements, no bruit auscultated on the right, no bruit auscultated on the left, no decreased range of motion, non-tender. Trachea-midline. Thyroid Gland Characteristics - non-tender.  Eye Eyeball - Left-Extraocular movements intact, No Nystagmus. Eyeball - Right-Extraocular movements intact, No Nystagmus. Cornea - Left-No Hazy. Cornea - Right-No Hazy. Sclera/Conjunctiva - Left-No scleral  icterus, No Discharge. Sclera/Conjunctiva - Right-No scleral icterus, No Discharge. Pupil - Left-Direct reaction to light normal. Pupil - Right-Direct reaction to light normal.  ENMT Ears Pinna - Left - no drainage observed, no generalized tenderness observed. Right - no drainage observed, no generalized tenderness observed. Nose and Sinuses External Inspection of the Nose - no destructive lesion observed. Inspection of the nares - Left - quiet respiration. Right - quiet respiration. Mouth and Throat Lips - Upper Lip - no fissures observed, no pallor noted. Lower Lip - no fissures observed, no pallor noted. Nasopharynx - no discharge present. Oral Cavity/Oropharynx - Tongue - no dryness observed. Oral Mucosa - no cyanosis observed. Hypopharynx - no evidence of airway distress observed.  Chest and Lung Exam Inspection Movements - Normal and Symmetrical. Accessory muscles - No use of accessory muscles in breathing. Palpation Palpation of the chest reveals - Non-tender. Auscultation Breath sounds - Normal and Clear.  Cardiovascular Auscultation Rhythm - Regular. Murmurs & Other Heart Sounds - Auscultation of the heart reveals - No Murmurs and No Systolic Clicks.  Abdomen Inspection Inspection of the abdomen reveals - No Visible peristalsis and No Abnormal pulsations. Umbilicus - No Bleeding, No Urine drainage. Palpation/Percussion Palpation and Percussion of the abdomen reveal - Soft, Non Tender, No Rebound tenderness, No Rigidity (guarding) and No Cutaneous hyperesthesia.  Male Genitourinary Sexual Maturity Tanner 5 - Adult hair pattern and Adult penile size and shape. Note: Circumcised normal external male genitalia. No hernias. No warts.   Rectal Note: Perianal skin and clean. Right posterior internal hemorrhoid partially prolapsed in. Prolapses more with Valsalva. Inflamed left lateral and right anterior internal hemorrhoids as well. Somewhat increased sphincter  tone.Marland Kitchen. He tolerates digital and anoscopic examination but very anxious. No fissure. No fistula. No pilonidal disease. No warts. No proctitis. Prostate smooth and not enlarged.   Peripheral Vascular Upper Extremity Inspection - Left - No Cyanotic nailbeds, Not Ischemic. Right - No Cyanotic nailbeds, Not Ischemic.  Neurologic Neurologic evaluation reveals -normal attention span and ability to concentrate, able to name objects and repeat phrases. Appropriate fund of knowledge , normal sensation and normal coordination. Mental Status Affect - not angry, not paranoid. Cranial Nerves-Normal Bilaterally. Gait-Normal.  Neuropsychiatric Mental status exam performed with findings of-able to articulate well with normal speech/language, rate, volume and coherence, thought content normal with ability to perform basic computations and apply abstract reasoning and no evidence of hallucinations, delusions, obsessions or homicidal/suicidal ideation. Note: Anxious but consolable.   Musculoskeletal Global Assessment Spine, Ribs and Pelvis - no instability, subluxation or laxity. Right Upper Extremity - no instability, subluxation or laxity.  Lymphatic Head & Neck  General Head & Neck Lymphatics: Bilateral - Description - No Localized lymphadenopathy. Axillary  General Axillary Region: Bilateral - Description - No Localized lymphadenopathy. Femoral & Inguinal  Generalized Femoral & Inguinal Lymphatics: Left - Description - No Localized lymphadenopathy. Right - Description - No Localized lymphadenopathy.    Assessment & Plan Luke Sportsman MD; 04/29/2014 6:04 PM) PROLAPSED INTERNAL HEMORRHOIDS, GRADE 3 (455.2  K64.8) Impression: Third degree internal hemorrhoids partially prolapsing. Significant bleeding requiring transfusion without major digestive tract etiology aside from mild esophagitis. Very sensitive and uncomfortable.  He can benefit from a series of banding to help calm  the hemorrhoids although not as effective in bleeding hemorrhoids. Other option as hemorrhoidal ligation with THD. He feels he cannot tolerate banding and wishes to have surgery. I cautioned him increased postprocedural/postoperative pain. I worry with his anxiety and stress he may not cope with it initially. However, it is a more definitive treatment for the problem. Discuss with his wife as well. Both agree:  The anatomy & physiology of the anorectal region was discussed. The pathophysiology of hemorrhoids and differential diagnosis was discussed. Natural history risks without surgery was discussed. I stressed the importance of a bowel regimen to have daily soft bowel movements to minimize progression of disease. Interventions such as sclerotherapy & banding were discussed.  The patient's symptoms are not adequately controlled by medicines and other non-operative treatments. I feel the risks & problems of no surgery outweigh the operative risks; therefore, I recommended surgery to treat the hemorrhoids by ligation, pexy, and possible resection.  Risks such as bleeding, infection, urinary difficulties, need for further treatment, heart attack, death, and other risks were discussed. I noted a good likelihood this will help address the problem. Goals of post-operative recovery were discussed as well. Possibility that this will not correct all symptoms was explained. Post-operative pain, bleeding, constipation, and other problems after surgery were discussed. We will work to minimize complications. Educational handouts further explaining the pathology, treatment options, and bowel regimen were given as well. Questions were answered. The patient expresses understanding & wishes to proc Current Plans  Schedule for Surgery Pt Education - [facility] Hemorrhoids (Eliora Nienhuis) Pt Education - [facility] Good Bowel Health (Tarik Teixeira) Pt Education - [facility] Rectal Surgery HCI (Anjelina Dung): discussed with patient and provided information. The  anatomy & physiology of the anorectal region was discussed. The pathophysiology of hemorrhoids and differential diagnosis was discussed. Natural history risks without surgery was discussed. I stressed the importance of a bowel regimen to have daily soft bowel movements to minimize progression of disease. Interventions such as sclerotherapy & banding were discussed.  The patient's symptoms are not adequately controlled by medicines and other non-operative treatments. I feel the risks & problems of no surgery outweigh the operative risks; therefore, I recommended surgery to treat the hemorrhoids by ligation, pexy, and possible resection.  Risks such as bleeding, infection, urinary difficulties, need for further treatment, heart attack, death, and other risks were discussed. I noted a good likelihood this will help address the problem. Goals of post-operative recovery were discussed as well.  Possibility that this will not correct all symptoms was explained. Post-operative pain, bleeding, constipation, and other problems after surgery were discussed. We will work to minimize complications. Educational handouts further explaining the pathology, treatment options, and bowel regimen were given as well. Questions were answered. The patient expresses understanding & wishes to proceed with surgery. ANOSCOPY, DIAGNOSTIC ([id number])   Signed by Luke Sportsman, MD (04/29/2014 6:06 PM)

## 2014-06-18 NOTE — Patient Instructions (Addendum)
Luke Kim  06/18/2014   Your procedure is scheduled on: 06/29/14   Report to [facility] Main  Entrance and follow signs to               Short Stay Center at 12:00 PM.   Call this number if you have problems the morning of surgery [phone]   Remember:  Do not eat food  :After Midnight             MAY HAVE CLEAR LIQUIDS UNTIL 8:00 AM  CLEAR LIQUID DIET   Foods Allowed                                                                     Foods Excluded  Coffee and tea, regular and decaf                             liquids that you cannot  Plain Jell-O in any flavor                                             see through such as: Fruit ices (not with fruit pulp)                                     milk, soups, orange juice  Iced Popsicles                                                 All solid food Carbonated beverages, regular and diet                                    Cranberry, grape and apple juices Sports drinks like Gatorade Lightly seasoned clear broth or consume(fat free) Sugar, honey syrup    _____________________________________________________________________     Take these medicines the morning of surgery with A SIP OF WATER: XANAX / AMLODIPINE / ZYRTEC / PANTOPRAZOLE            STOP ASPIRIN AND HERBAL MEDS 5 DAYS PREOP                               You may not have any metal on your body including hair pins and              piercings  Do not wear jewelry, make-up, lotions, powders or perfumes.             Do not wear nail polish.  Do not shave  48 hours prior to surgery.              Men may shave face and neck.   Do not bring valuables to the hospital.  [facility] IS NOT             RESPONSIBLE   FOR VALUABLES.  Contacts, dentures or bridgework may not be worn into surgery.  Leave suitcase in the car. After surgery it may be brought to your room.     Patients discharged the day of surgery will not be allowed to drive  home.  Name and phone number of your driver:  Special Instructions: N/A              Please read over the following fact sheets you were given: _____________________________________________________________________                                                     [facility] - PREPARING FOR SURGERY  Before surgery, you can play an important role.  Because skin is not sterile, your skin needs to be as free of germs as possible.  You can reduce the number of germs on your skin by washing with CHG (chlorahexidine gluconate) soap before surgery.  CHG is an antiseptic cleaner which kills germs and bonds with the skin to continue killing germs even after washing. Please DO NOT use if you have an allergy to CHG or antibacterial soaps.  If your skin becomes reddened/irritated stop using the CHG and inform your nurse when you arrive at Short Stay. Do not shave (including legs and underarms) for at least 48 hours prior to the first CHG shower.  You may shave your face. Please follow these instructions carefully:   1.  Shower with CHG Soap the night before surgery and the  morning of Surgery.   2.  If you choose to wash your hair, wash your hair first as usual with your  normal  Shampoo.   3.  After you shampoo, rinse your hair and body thoroughly to remove the  shampoo.                                         4.  Use CHG as you would any other liquid soap.  You can apply chg directly  to the skin and wash . Gently wash with scrungie or clean wascloth    5.  Apply the CHG Soap to your body ONLY FROM THE NECK DOWN.   Do not use on open                           Wound or open sores. Avoid contact with eyes, ears mouth and genitals (private parts).                        Genitals (private parts) with your normal soap.              6.  Wash thoroughly, paying special attention to the area where your surgery  will be performed.   7.  Thoroughly rinse your body with warm water from the neck  down.   8.  DO NOT shower/wash with your normal soap after using and rinsing off  the CHG Soap .                9.  Pat yourself dry  with a clean towel.             10.  Wear clean pajamas.             11.  Place clean sheets on your bed the night of your first shower and do not  sleep with pets.  Day of Surgery : Do not apply any lotions/deodorants the morning of surgery.  Please wear clean clothes to the hospital/surgery center.  FAILURE TO FOLLOW THESE INSTRUCTIONS MAY RESULT IN THE CANCELLATION OF YOUR SURGERY    PATIENT SIGNATURE_________________________________  ______________________________________________________________________

## 2014-06-19 ENCOUNTER — Encounter (HOSPITAL_COMMUNITY)
Admission: RE | Admit: 2014-06-19 | Discharge: 2014-06-19 | Disposition: A | Discharge status: Home / Self Care | Payer: 59 | Source: Ambulatory Visit | Attending: Surgery | Admitting: Surgery

## 2014-06-19 ENCOUNTER — Encounter (HOSPITAL_COMMUNITY): Payer: Self-pay

## 2014-06-19 DIAGNOSIS — Z01818 Encounter for other preprocedural examination: Secondary | ICD-10-CM | POA: Diagnosis not present

## 2014-06-19 DIAGNOSIS — K648 Other hemorrhoids: Secondary | ICD-10-CM | POA: Diagnosis not present

## 2014-06-19 HISTORY — DX: Unspecified hemorrhoids: K64.9

## 2014-06-19 HISTORY — DX: Anemia, unspecified: D64.9

## 2014-06-19 HISTORY — DX: Personal history of other medical treatment: Z92.89

## 2014-06-19 HISTORY — DX: Gastric ulcer, unspecified as acute or chronic, without hemorrhage or perforation: K25.9

## 2014-06-19 LAB — CBC
HEMATOCRIT: 43.2 % (ref 39.0–52.0)
Hemoglobin: 12.7 g/dL — ABNORMAL LOW (ref 13.0–17.0)
MCH: 21.7 pg — ABNORMAL LOW (ref 26.0–34.0)
MCHC: 29.4 g/dL — ABNORMAL LOW (ref 30.0–36.0)
MCV: 73.8 fL — ABNORMAL LOW (ref 78.0–100.0)
PLATELETS: 502 10*3/uL — AB (ref 150–400)
RBC: 5.85 MIL/uL — ABNORMAL HIGH (ref 4.22–5.81)
RDW: 21.9 % — ABNORMAL HIGH (ref 11.5–15.5)
WBC: 7.8 10*3/uL (ref 4.0–10.5)

## 2014-06-19 LAB — BASIC METABOLIC PANEL
Anion gap: 6 (ref 5–15)
BUN: 26 mg/dL — AB (ref 6–23)
CO2: 28 mmol/L (ref 19–32)
Calcium: 9 mg/dL (ref 8.4–10.5)
Chloride: 105 mmol/L (ref 96–112)
Creatinine, Ser: 1.37 mg/dL — ABNORMAL HIGH (ref 0.50–1.35)
GFR, EST AFRICAN AMERICAN: 73 mL/min — AB (ref >90)
GFR, EST NON AFRICAN AMERICAN: 63 mL/min — AB (ref >90)
Glucose, Bld: 117 mg/dL — ABNORMAL HIGH (ref 70–99)
Potassium: 5.4 mmol/L — ABNORMAL HIGH (ref 3.5–5.1)
Sodium: 139 mmol/L (ref 135–145)

## 2014-06-19 NOTE — Progress Notes (Signed)
06-19-14 1610 Labs viewable in Epic. Note BMP results.

## 2014-06-19 NOTE — Progress Notes (Signed)
   06/19/14 1437  OBSTRUCTIVE SLEEP APNEA  Have you ever been diagnosed with sleep apnea through a sleep study? No  Do you snore loudly (loud enough to be heard through closed doors)?  0  Do you often feel tired, fatigued, or sleepy during the daytime? 0  Has anyone observed you stop breathing during your sleep? 0  Do you have, or are you being treated for high blood pressure? 1  BMI more than 35 kg/m2? 1  Age over 42 years old? 0  Neck circumference greater than 40 cm/16 inches? 1  Gender: 1  Obstructive Sleep Apnea Score 4  Score 4 or greater  Results sent to PCP

## 2014-06-29 ENCOUNTER — Ambulatory Visit (HOSPITAL_COMMUNITY)
Admission: RE | Admit: 2014-06-29 | Discharge: 2014-06-29 | Disposition: A | Discharge status: Home / Self Care | Payer: 59 | Source: Ambulatory Visit | Attending: Surgery | Admitting: Surgery

## 2014-06-29 ENCOUNTER — Encounter (HOSPITAL_COMMUNITY)
Admission: RE | Disposition: A | Discharge status: Home / Self Care | Payer: Self-pay | Source: Ambulatory Visit | Attending: Surgery

## 2014-06-29 ENCOUNTER — Encounter (HOSPITAL_COMMUNITY): Payer: Self-pay | Admitting: Anesthesiology

## 2014-06-29 ENCOUNTER — Ambulatory Visit (HOSPITAL_COMMUNITY): Payer: 59 | Admitting: Anesthesiology

## 2014-06-29 DIAGNOSIS — K219 Gastro-esophageal reflux disease without esophagitis: Secondary | ICD-10-CM | POA: Insufficient documentation

## 2014-06-29 DIAGNOSIS — K648 Other hemorrhoids: Secondary | ICD-10-CM | POA: Insufficient documentation

## 2014-06-29 DIAGNOSIS — Z79899 Other long term (current) drug therapy: Secondary | ICD-10-CM | POA: Insufficient documentation

## 2014-06-29 DIAGNOSIS — F419 Anxiety disorder, unspecified: Secondary | ICD-10-CM | POA: Insufficient documentation

## 2014-06-29 DIAGNOSIS — I10 Essential (primary) hypertension: Secondary | ICD-10-CM | POA: Insufficient documentation

## 2014-06-29 HISTORY — PX: TRANSANAL HEMORRHOIDAL DEARTERIALIZATION: SHX6136

## 2014-06-29 SURGERY — TRANSANAL HEMORRHOIDAL DEARTERIALIZATION
Anesthesia: General | Site: Rectum

## 2014-06-29 MED ORDER — OXYCODONE HCL 5 MG/5ML PO SOLN
5.0000 mg | Freq: Once | ORAL | Status: AC | PRN
  Filled 2014-06-29: qty 5

## 2014-06-29 MED ORDER — LIDOCAINE HCL (CARDIAC) 20 MG/ML IV SOLN
INTRAVENOUS | Status: AC
  Filled 2014-06-29: qty 5

## 2014-06-29 MED ORDER — FENTANYL CITRATE 0.05 MG/ML IJ SOLN: INTRAMUSCULAR | Status: DC | PRN

## 2014-06-29 MED ORDER — HYDROMORPHONE HCL 1 MG/ML IJ SOLN
0.2500 mg | INTRAMUSCULAR | Status: DC | PRN
  Administered 2014-06-29 (×4): 0.25 mg via INTRAVENOUS

## 2014-06-29 MED ORDER — HYDROMORPHONE HCL 2 MG/ML IJ SOLN
INTRAMUSCULAR | Status: AC
  Filled 2014-06-29: qty 1

## 2014-06-29 MED ORDER — NEOSTIGMINE METHYLSULFATE 10 MG/10ML IV SOLN
INTRAVENOUS | Status: DC | PRN
  Administered 2014-06-29: 3.5 mg via INTRAVENOUS

## 2014-06-29 MED ORDER — OXYCODONE HCL 5 MG PO TABS: 5.0000 mg | ORAL_TABLET | ORAL | Status: DC | PRN

## 2014-06-29 MED ORDER — HYDROMORPHONE HCL 1 MG/ML IJ SOLN
INTRAMUSCULAR | Status: AC
  Filled 2014-06-29: qty 1

## 2014-06-29 MED ORDER — METRONIDAZOLE IN NACL 5-0.79 MG/ML-% IV SOLN
500.0000 mg | INTRAVENOUS | Status: AC
Start: 2014-06-30 — End: 2014-06-29
  Administered 2014-06-29: 500 mg via INTRAVENOUS
  Filled 2014-06-29: qty 100

## 2014-06-29 MED ORDER — MIDAZOLAM HCL 2 MG/2ML IJ SOLN
INTRAMUSCULAR | Status: AC
  Filled 2014-06-29: qty 2

## 2014-06-29 MED ORDER — HYDROMORPHONE HCL 1 MG/ML IJ SOLN
INTRAMUSCULAR | Status: DC | PRN
  Administered 2014-06-29: 0.5 mg via INTRAVENOUS
  Administered 2014-06-29: 1 mg via INTRAVENOUS
  Administered 2014-06-29: 0.5 mg via INTRAVENOUS
  Administered 2014-06-29 (×2): 1 mg via INTRAVENOUS

## 2014-06-29 MED ORDER — ROCURONIUM BROMIDE 100 MG/10ML IV SOLN
INTRAVENOUS | Status: AC
  Filled 2014-06-29: qty 1

## 2014-06-29 MED ORDER — CEFAZOLIN SODIUM-DEXTROSE 2-3 GM-% IV SOLR
INTRAVENOUS | Status: AC
  Filled 2014-06-29: qty 50

## 2014-06-29 MED ORDER — GLYCOPYRROLATE 0.2 MG/ML IJ SOLN
INTRAMUSCULAR | Status: DC | PRN
  Administered 2014-06-29: 0.4 mg via INTRAVENOUS

## 2014-06-29 MED ORDER — FENTANYL CITRATE 0.05 MG/ML IJ SOLN
25.0000 ug | INTRAMUSCULAR | Status: DC | PRN
  Administered 2014-06-29: 50 ug via INTRAVENOUS
  Administered 2014-06-29 (×2): 25 ug via INTRAVENOUS
  Administered 2014-06-29 (×2): 50 ug via INTRAVENOUS

## 2014-06-29 MED ORDER — BUPIVACAINE LIPOSOME 1.3 % IJ SUSP
20.0000 mL | INTRAMUSCULAR | Status: DC
  Filled 2014-06-29: qty 20

## 2014-06-29 MED ORDER — ONDANSETRON HCL 4 MG/2ML IJ SOLN
INTRAMUSCULAR | Status: DC | PRN
  Administered 2014-06-29: 4 mg via INTRAVENOUS

## 2014-06-29 MED ORDER — DIBUCAINE 1 % RE OINT
TOPICAL_OINTMENT | RECTAL | Status: AC
  Filled 2014-06-29: qty 28

## 2014-06-29 MED ORDER — BUPIVACAINE-EPINEPHRINE 0.25% -1:200000 IJ SOLN
INTRAMUSCULAR | Status: AC
  Filled 2014-06-29: qty 1

## 2014-06-29 MED ORDER — GLYCOPYRROLATE 0.2 MG/ML IJ SOLN
INTRAMUSCULAR | Status: AC
  Filled 2014-06-29: qty 2

## 2014-06-29 MED ORDER — OXYCODONE HCL 5 MG PO TABS
5.0000 mg | ORAL_TABLET | ORAL | Status: DC | PRN
  Filled 2014-06-29: qty 1

## 2014-06-29 MED ORDER — KETOROLAC TROMETHAMINE 30 MG/ML IJ SOLN
INTRAMUSCULAR | Status: DC | PRN
  Administered 2014-06-29: 30 mg via INTRAVENOUS

## 2014-06-29 MED ORDER — FENTANYL CITRATE 0.05 MG/ML IJ SOLN
INTRAMUSCULAR | Status: AC
  Filled 2014-06-29: qty 2

## 2014-06-29 MED ORDER — NAPROXEN 500 MG PO TABS: 250.0000 mg | ORAL_TABLET | Freq: Two times a day (BID) | ORAL | Status: DC

## 2014-06-29 MED ORDER — CEFAZOLIN SODIUM-DEXTROSE 2-3 GM-% IV SOLR
2.0000 g | INTRAVENOUS | Status: AC
  Administered 2014-06-29: 2 g via INTRAVENOUS

## 2014-06-29 MED ORDER — SODIUM CHLORIDE 0.9 % IJ SOLN
INTRAMUSCULAR | Status: AC
  Filled 2014-06-29: qty 50

## 2014-06-29 MED ORDER — ROCURONIUM BROMIDE 100 MG/10ML IV SOLN
INTRAVENOUS | Status: DC | PRN
  Administered 2014-06-29 (×2): 20 mg via INTRAVENOUS
  Administered 2014-06-29: 5 mg via INTRAVENOUS

## 2014-06-29 MED ORDER — 0.9 % SODIUM CHLORIDE (POUR BTL) OPTIME
TOPICAL | Status: DC | PRN
  Administered 2014-06-29: 1000 mL

## 2014-06-29 MED ORDER — KETOROLAC TROMETHAMINE 30 MG/ML IJ SOLN
INTRAMUSCULAR | Status: AC
  Filled 2014-06-29: qty 1

## 2014-06-29 MED ORDER — CHLORHEXIDINE GLUCONATE 4 % EX LIQD: 1.0000 "application " | Freq: Once | CUTANEOUS | Status: DC

## 2014-06-29 MED ORDER — LIDOCAINE HCL (CARDIAC) 20 MG/ML IV SOLN
INTRAVENOUS | Status: DC | PRN
  Administered 2014-06-29: 60 mg via INTRAVENOUS

## 2014-06-29 MED ORDER — ONDANSETRON HCL 4 MG/2ML IJ SOLN
INTRAMUSCULAR | Status: AC
  Filled 2014-06-29: qty 2

## 2014-06-29 MED ORDER — BUPIVACAINE LIPOSOME 1.3 % IJ SUSP
INTRAMUSCULAR | Status: DC | PRN
  Administered 2014-06-29: 20 mL

## 2014-06-29 MED ORDER — PROPOFOL 10 MG/ML IV BOLUS
INTRAVENOUS | Status: DC | PRN
  Administered 2014-06-29: 300 mg via INTRAVENOUS

## 2014-06-29 MED ORDER — OXYCODONE HCL 5 MG PO TABS
5.0000 mg | ORAL_TABLET | Freq: Once | ORAL | Status: AC | PRN
  Administered 2014-06-29: 5 mg via ORAL

## 2014-06-29 MED ORDER — OXYCODONE HCL 5 MG PO TABS
ORAL_TABLET | ORAL | Status: AC
  Administered 2014-06-29: 5 mg
  Filled 2014-06-29: qty 1

## 2014-06-29 MED ORDER — PROPOFOL 10 MG/ML IV BOLUS
INTRAVENOUS | Status: AC
  Filled 2014-06-29: qty 20

## 2014-06-29 MED ORDER — METRONIDAZOLE IN NACL 5-0.79 MG/ML-% IV SOLN
INTRAVENOUS | Status: AC
  Filled 2014-06-29: qty 100

## 2014-06-29 MED ORDER — PROMETHAZINE HCL 25 MG/ML IJ SOLN: 6.2500 mg | INTRAMUSCULAR | Status: DC | PRN

## 2014-06-29 MED ORDER — KETOROLAC TROMETHAMINE 30 MG/ML IJ SOLN
INTRAMUSCULAR | Status: AC
Start: 2014-06-29 — End: 2014-06-29
  Filled 2014-06-29: qty 1

## 2014-06-29 MED ORDER — SODIUM CHLORIDE 0.9 % IJ SOLN
INTRAMUSCULAR | Status: DC | PRN
  Administered 2014-06-29: 20 mL

## 2014-06-29 MED ORDER — SUCCINYLCHOLINE CHLORIDE 20 MG/ML IJ SOLN
INTRAMUSCULAR | Status: DC | PRN
  Administered 2014-06-29: 140 mg via INTRAVENOUS

## 2014-06-29 MED ORDER — FENTANYL CITRATE 0.05 MG/ML IJ SOLN
INTRAMUSCULAR | Status: AC
  Filled 2014-06-29: qty 5

## 2014-06-29 MED ORDER — FENTANYL CITRATE 0.05 MG/ML IJ SOLN
INTRAMUSCULAR | Status: DC | PRN
  Administered 2014-06-29 (×5): 50 ug via INTRAVENOUS
  Administered 2014-06-29 (×2): 100 ug via INTRAVENOUS

## 2014-06-29 MED ORDER — LACTATED RINGERS IV SOLN
INTRAVENOUS | Status: DC
  Administered 2014-06-29: 17:00:00 via INTRAVENOUS
  Administered 2014-06-29: 1000 mL via INTRAVENOUS

## 2014-06-29 MED ORDER — MIDAZOLAM HCL 2 MG/2ML IJ SOLN
2.0000 mg | Freq: Once | INTRAMUSCULAR | Status: AC
  Administered 2014-06-29 (×2): 1 mg via INTRAVENOUS

## 2014-06-29 MED ORDER — MIDAZOLAM HCL 5 MG/5ML IJ SOLN
INTRAMUSCULAR | Status: DC | PRN
  Administered 2014-06-29: 2 mg via INTRAVENOUS

## 2014-06-29 SURGICAL SUPPLY — 28 items
BLADE HEX COATED 2.75 (ELECTRODE) ×2 IMPLANT
BLADE SURG 15 STRL LF DISP TIS (BLADE) IMPLANT
BLADE SURG 15 STRL SS (BLADE)
BRIEF STRETCH FOR OB PAD LRG (UNDERPADS AND DIAPERS) ×2 IMPLANT
CONT SPEC 4OZ CLIKSEAL STRL BL (MISCELLANEOUS) ×2 IMPLANT
DECANTER SPIKE VIAL GLASS SM (MISCELLANEOUS) ×2 IMPLANT
DRAPE LAPAROTOMY T 102X78X121 (DRAPES) ×2 IMPLANT
DRSG PAD ABDOMINAL 8X10 ST (GAUZE/BANDAGES/DRESSINGS) IMPLANT
ELECT REM PT RETURN 9FT ADLT (ELECTROSURGICAL) ×2
ELECTRODE REM PT RTRN 9FT ADLT (ELECTROSURGICAL) ×1 IMPLANT
GAUZE SPONGE 4X4 12PLY STRL (GAUZE/BANDAGES/DRESSINGS) IMPLANT
GAUZE SPONGE 4X4 16PLY XRAY LF (GAUZE/BANDAGES/DRESSINGS) ×2 IMPLANT
GLOVE BIOGEL PI IND STRL 8 (GLOVE) ×1 IMPLANT
GLOVE BIOGEL PI INDICATOR 8 (GLOVE) ×1
GLOVE ECLIPSE 8.0 STRL XLNG CF (GLOVE) ×2 IMPLANT
GOWN STRL REUS W/TWL XL LVL3 (GOWN DISPOSABLE) ×6 IMPLANT
KIT BASIN OR (CUSTOM PROCEDURE TRAY) ×2 IMPLANT
KIT SLIDE ONE PROLAPS HEMORR (KITS) ×2 IMPLANT
LUBRICANT JELLY K Y 4OZ (MISCELLANEOUS) ×2 IMPLANT
NEEDLE HYPO 22GX1.5 SAFETY (NEEDLE) ×2 IMPLANT
PACK BASIC VI WITH GOWN DISP (CUSTOM PROCEDURE TRAY) ×2 IMPLANT
PENCIL BUTTON HOLSTER BLD 10FT (ELECTRODE) ×2 IMPLANT
SUT CHROMIC 3 0 SH 27 (SUTURE) ×6 IMPLANT
SUT VIC AB 2-0 UR6 27 (SUTURE) IMPLANT
SYR 20CC LL (SYRINGE) ×2 IMPLANT
TOWEL OR 17X26 10 PK STRL BLUE (TOWEL DISPOSABLE) ×2 IMPLANT
TOWEL OR NON WOVEN STRL DISP B (DISPOSABLE) ×2 IMPLANT
YANKAUER SUCT BULB TIP 10FT TU (MISCELLANEOUS) ×2 IMPLANT

## 2014-06-29 NOTE — Interval H&P Note (Signed)
History and Physical Interval Note:  06/29/2014 12:53 PM  Luke MachoLuke Stout  has presented today for surgery, with the diagnosis of Internal Bleeding Hemorrhoid  The various methods of treatment have been discussed with the patient and family. After consideration of risks, benefits and other options for treatment, the patient has consented to  Procedure(s): TRANSANAL HEMORRHOIDAL DEARTERIALIZATION LIGATION PEXY, EXAM UNDER ANESTHESIA, POSSIBLE HEMORRHOIDECTOMY (N/A) as a surgical intervention .  The patient's history has been reviewed, patient examined, no change in status, stable for surgery.  I have reviewed the patient's chart and labs.  Questions were answered to the patient's satisfaction.     Klee Kolek C.

## 2014-06-29 NOTE — H&P (Signed)
Luke MachoLuke Wentland 06/16/2014 3:06 PM Location: [facility] Patient #: [id number] DOB: [birth year] Married / Language: [place] / Race: Undefined Male  History of Present Illness [name](Lanell Carpenter C. Serinity Ware MD; 06/16/2014 5:09 PM) Patient words: EXCESSIVE BLEEDING and Pain from rectum.  The patient is a 44 year old male who presents with hemorrhoids. Patient returns with concerns about his hemorrhoids. He has persistent bleeding. Is not sometimes a pulse spray with bowel movements. Has to change gauze a couple times a day. No major bleeding as in November. We recommend he consider getting blood drawn last week. That did not happen. He is due to get blood drawn in 2 days. He is tentatively set up for hemorrhoid surgery 06/29/2014. Having some worsening pain. Taking intermittent naproxen. Did not know he should not take. Has been on no other pain meds. Not doing soaks. Worried about moisture and itching. Trying to work but hard to sit. Continues to do intense weightlifting / training every day. Wonders if should stop. Has many many questions. Many many concerns.   Other Problems [name](Renton Berkley C Keontre Defino, MD; 06/16/2014 3:54 PM) PROLAPSED INTERNAL HEMORRHOIDS, GRADE 3 (455.2  K64.8) Anxiety Disorder Gastroesophageal Reflux Disease High blood pressure  Past Surgical History [name](Robertt Buda C Pedro Whiters, MD; 06/16/2014 3:54 PM) No pertinent past surgical history  Diagnostic Studies History [name](Kleo Dungee C Jeanpierre Thebeau, MD; 06/16/2014 3:54 PM) Colonoscopy within last year  Allergies Jake Church(Jason McDowell, LPN; 0/45/40981/26/2016 1:193:06 PM) No Known Drug Allergies12/01/2014  Medication History Jake Church(Jason McDowell, LPN; 1/47/82951/26/2016 6:213:06 PM) ALPRAZolam (1MG  Tablet, Oral) Active. Zolpidem Tartrate (10MG  Tablet, Oral) Active. AmLODIPine Besylate (10MG  Tablet, Oral) Active. Docusate Sodium (100MG  Capsule, Oral) Active. HydrOXYzine HCl (50MG  Tablet, Oral) Active. Pantoprazole Sodium (40MG  Tablet DR, Oral) Active. Benazepril HCl  (20MG  Tablet, Oral) Active.  Social History [name](Marvelous Woolford C Adonai Helzer, MD; 06/16/2014 3:54 PM) Alcohol use Occasional alcohol use. Caffeine use Coffee. Tobacco use Never smoker.  Family History [name](Hillman Attig C Nera Haworth, MD; 06/16/2014 3:54 PM) Depression Mother. Hypertension Father, Mother. Ischemic Bowel Disease Brother, Father.  Review of Systems [name](Haylea Schlichting C Destynie Toomey, MD; 06/16/2014 5:13 00) General Not Present- Appetite Loss, Chills, Fatigue, Fever, Night Sweats, Weight Gain and Weight Loss. Skin Not Present- Change in Wart/Mole, Dryness, Hives, Jaundice, New Lesions, Non-Healing Wounds, Rash and Ulcer. HEENT Not Present- Earache, Hearing Loss, Hoarseness, Nose Bleed, Oral Ulcers, Ringing in the Ears, Seasonal Allergies, Sinus Pain, Sore Throat, Visual Disturbances, Wears glasses/contact lenses and Yellow Eyes. Respiratory Not Present- Bloody sputum, Chronic Cough, Difficulty Breathing, Snoring and Wheezing. Breast Not Present- Breast Mass, Breast Pain, Nipple Discharge and Skin Changes. Cardiovascular Not Present- Chest Pain, Difficulty Breathing Lying Down, Leg Cramps, Palpitations, Rapid Heart Rate, Shortness of Breath and Swelling of Extremities. Gastrointestinal Present- Bloody Stool and Hemorrhoids. Not Present- Abdominal Pain, Bloating, Change in Bowel Habits, Chronic diarrhea, Constipation, Difficulty Swallowing, Excessive gas, Gets full quickly at meals, Indigestion, Nausea, Rectal Pain and Vomiting. Male Genitourinary Not Present- Blood in Urine, Change in Urinary Stream, Frequency, Impotence, Nocturia, Painful Urination, Urgency and Urine Leakage. Musculoskeletal Not Present- Back Pain, Joint Pain, Joint Stiffness, Muscle Pain, Muscle Weakness and Swelling of Extremities. Neurological Not Present- Decreased Memory, Fainting, Headaches, Numbness, Seizures, Tingling, Tremor, Trouble walking and Weakness. Psychiatric Present- Anxiety. Not Present- Bipolar, Change in Sleep Pattern, Depression, Fearful  and Frequent crying. Endocrine Not Present- Cold Intolerance, Excessive Hunger, Hair Changes, Heat Intolerance and New Diabetes. Hematology Not Present- Easy Bruising, Excessive bleeding, Gland problems, HIV and Persistent Infections.   Vitals Jake Church(Jason McDowell LPN; 3/08/65781/26/2016 4:693:07 PM) 06/16/2014 3:06 PM Weight:  265.25 lb Height: 71in Body Surface Area: 2.46 m Body Mass Index: 36.99 kg/m Temp.: 98.26F(Temporal)  Pulse: 82 (Regular)  Resp.: 20 (Unlabored)  BP: 160/98 (Sitting, Left Arm, Standard)    Physical Exam [name] MD; 06/16/2014 5:10 PM) General Mental Status-Alert. General Appearance-Not in acute distress, Not Sickly. Orientation-Oriented X3. Hydration-Well hydrated. Voice-Normal.  Integumentary Global Assessment Normal Exam - Axillae: non-tender, no inflammation or ulceration, no drainage. and Distribution of scalp and body hair is normal. General Characteristics Temperature - normal warmth is noted.  Head and Neck Head-normocephalic, atraumatic with no lesions or palpable masses. Face Global Assessment - atraumatic, no absence of expression. Neck Global Assessment - no abnormal movements, no bruit auscultated on the right, no bruit auscultated on the left, no decreased range of motion, non-tender. Trachea-midline. Thyroid Gland Characteristics - non-tender.  Eye Eyeball - Left-Extraocular movements intact, No Nystagmus. Eyeball - Right-Extraocular movements intact, No Nystagmus. Cornea - Left-No Hazy. Cornea - Right-No Hazy. Sclera/Conjunctiva - Left-No scleral icterus, No Discharge. Sclera/Conjunctiva - Right-No scleral icterus, No Discharge. Pupil - Left-Direct reaction to light normal. Pupil - Right-Direct reaction to light normal.  ENMT Ears Pinna - Left - no drainage observed, no generalized tenderness observed. Right - no drainage observed, no generalized tenderness observed. Nose and Sinuses Nose -  no destructive lesion observed. Nares - Left - quiet respiration. Right - quiet respiration. Mouth and Throat Lips - Upper Lip - no fissures observed, no pallor noted. Lower Lip - no fissures observed, no pallor noted. Nasopharynx - no discharge present. Oral Cavity/Oropharynx - Tongue - no dryness observed. Oral Mucosa - no cyanosis observed. Hypopharynx - no evidence of airway distress observed.  Chest and Lung Exam Inspection Movements - Normal and Symmetrical. Accessory muscles - No use of accessory muscles in breathing. Palpation Normal exam - Non-tender. Auscultation Breath sounds - Normal and Clear.  Cardiovascular Auscultation Rhythm - Regular. Murmurs & Other Heart Sounds - Normal exam - No Murmurs and No Systolic Clicks.  Abdomen Inspection Normal Exam - No Visible peristalsis and No Abnormal pulsations. Umbilicus - No Bleeding, No Urine drainage. Palpation/Percussion Normal exam - Soft, Non Tender, No Rebound tenderness, No Rigidity (guarding) and No Cutaneous hyperesthesia.  Male Genitourinary Sexual Maturity Tanner 5 - Adult hair pattern and Adult penile size and shape. Note: Circumcised normal external male genitalia. No hernias. No warts.   Rectal Note: Perianal skin and clean. Inflamed left lateral and right posterior hemorrhoids but no major prolapse. No thrombosed hemorrhoid. No abscess. Somewhat increased sphincter tone. No fissure. No fistula. No pilonidal disease. No warts.    Peripheral Vascular Upper Extremity Inspection - Left - No Cyanotic nailbeds, Not Ischemic. Right - No Cyanotic nailbeds, Not Ischemic.  Neurologic Neurologic evaluation reveals -normal attention span and ability to concentrate, able to name objects and repeat phrases. Appropriate fund of knowledge , normal sensation and normal coordination. Mental Status Affect - not angry, not paranoid. Cranial Nerves-Normal Bilaterally. Gait-Normal.  Neuropsychiatric Mental  status exam performed with findings of-able to articulate well with normal speech/language, rate, volume and coherence, thought content normal with ability to perform basic computations and apply abstract reasoning and no evidence of hallucinations, delusions, obsessions or homicidal/suicidal ideation. Note: Anxious but consolable.   Musculoskeletal Global Assessment Spine, Ribs and Pelvis - no instability, subluxation or laxity. Right Upper Extremity - no instability, subluxation or laxity.  Lymphatic Head & Neck General Head & Neck Lymphatics: Bilateral - Description - No Localized lymphadenopathy. Axillary General Axillary Region: Bilateral -  Description - No Localized lymphadenopathy. Femoral & Inguinal Generalized Femoral & Inguinal Lymphatics: Left: Right - Description - No Localized lymphadenopathy. Description - No Localized lymphadenopathy.    Assessment & Plan [name] MD; 06/16/2014 5:13 PM) PROLAPSED INTERNAL HEMORRHOIDS, GRADE 3 (455.2  K64.8) Impression: Third degree internal hemorrhoids partially prolapsing. Significant bleeding requiring transfusion without major digestive tract etiology aside from mild esophagitis. Very sensitive and uncomfortable.  I spent 30 minutes addressing his concerns and questions. Again went over nonoperative treatment of hemorrhoids.  Recommend he do Tylenol Extra Strength round-the-clock.  Prescription for oxycodone. Work prescription for [employer] topical therapy. See if that helps.  Recommended he do warm soaks 6 times a day.  Recommended he hold off on any weight lifting or any other intense physical activity for the next few days. See if that activity as tolerated. Don't force things.  He would like to try and work & avoid time off and save it for the surgery. Hopefully he can do that. Current Plans  Started OxyCODONE HCl , 1-2 Tablet every six hours, as needed, #40, 06/16/2014, No Refill. Pt Education - [facility] Hemorrhoids  (Lynda Wanninger) Pt Education - [facility] Pain Control (Brentney Goldbach) Pt Education - [facility] Good Bowel Health (Cydne Grahn) Started Anusol-HC 2.5%, 1 (one) Cream four times daily, as needed, 2, 14 days starting 06/16/2014, Ref. x5. The anatomy & physiology of the anorectal region was discussed. The pathophysiology of hemorrhoids and differential diagnosis was discussed. Natural history risks without surgery was discussed. I stressed the importance of a bowel regimen to have daily soft bowel movements to minimize progression of disease. Interventions such as sclerotherapy & banding were discussed.  The patient's symptoms are not adequately controlled by medicines and other non-operative treatments. I feel the risks & problems of no surgery outweigh the operative risks; therefore, I recommended surgery to treat the hemorrhoids by ligation, pexy, and possible resection.  Risks such as bleeding, infection, urinary difficulties, need for further treatment, heart attack, death, and other risks were discussed. I noted a good likelihood this will help address the problem. Goals of post-operative recovery were discussed as well. Possibility that this will not correct all symptoms was explained. Post-operative pain, bleeding, constipation, and other problems after surgery were discussed. We will work to minimize complications. Educational handouts further explaining the pathology, treatment options, and bowel regimen were given as well. Questions were answered. The patient expresses understanding & wishes to proceed with surgery. ANEMIA, DEFICIENCY (281.9  D53.9) Impression: Check hemoglobin - getting labs in 2 days.  Increase iron to twice a day. Watch out for worsening constipation    Signed by [name], MD (06/16/2014 5:13 PM)  [name], M.D., F.A.C.S. Gastrointestinal and Minimally Invasive Surgery [facility] [address], [address] [place], [place] [address] [phone] Main /  Paging

## 2014-06-29 NOTE — Progress Notes (Signed)
Patient post op hemorroidectomy. Has ambulated to restroom and voided. Pain manageable (per patient) with po pain meds. Patient and wife verbalize understanding of discharge instructions. His temperature was 98 in PACU, 99.7 upon arrival to short stay, 100.1 at 1835 prior to discharge home. Called MD and Dr. Ezzard StandingNewman on call and ordered incentive spirometer for patient to use at home. Showed patient how to use and encouraged deep breaths. He will monitor his temperature at home and call MD if elevated. Discharge home with wife.

## 2014-06-29 NOTE — Transfer of Care (Signed)
Immediate Anesthesia Transfer of Care Note  Patient: Luke MachoLuke Fleischhacker  Procedure(s) Performed: Procedure(s): TRANSANAL HEMORRHOIDAL DEARTERIALIZATION LIGATION PEXY, EXAM UNDER ANESTHESIA, POSSIBLE HEMORRHOIDECTOMY (N/A)  Patient Location: PACU  Anesthesia Type:General  Level of Consciousness: awake, alert  and oriented  Airway & Oxygen Therapy: Patient Spontanous Breathing and Patient connected to face mask oxygen  Post-op Assessment: Report given to RN and Post -op Vital signs reviewed and stable  Post vital signs: Reviewed and stable  Last Vitals:  Filed Vitals:   06/29/14 1206  BP: 154/84  Pulse: 96  Temp: 36.8 C  Resp: 18    Complications: No apparent anesthesia complications

## 2014-06-29 NOTE — Anesthesia Postprocedure Evaluation (Signed)
Anesthesia Post Note  Patient: Luke Kim  Procedure(s) Performed: Procedure(s) (LRB): TRANSANAL HEMORRHOIDAL DEARTERIALIZATION LIGATION PEXY, EXAM UNDER ANESTHESIA, POSSIBLE HEMORRHOIDECTOMY (N/A)  Anesthesia type: general  Patient location: PACU  Post pain: Pain level controlled  Post assessment: Patient's Cardiovascular Status Stable  Last Vitals:  Filed Vitals:   06/29/14 1715  BP: 143/73  Pulse: 109  Temp: 36.7 C  Resp: 14    Post vital signs: Reviewed and stable  Level of consciousness: sedated  Complications: No apparent anesthesia complications

## 2014-06-29 NOTE — Op Note (Signed)
06/29/2014  3:19 PM  PATIENT:  Luke Kim  44 y.o. male  Patient Care Team: [name], MD as PCP - General (Family Medicine) Shirley Friar, MD as Consulting Physician (Gastroenterology)  PRE-OPERATIVE DIAGNOSIS:  Internal Bleeding Hemorrhoids  POST-OPERATIVE DIAGNOSIS:  Internal Bleeding Hemorrhoids  PROCEDURE:  Procedure(s): TRANSANAL HEMORRHOIDAL DEARTERIALIZATION LIGATION PEXY EXAM UNDER ANESTHESIA EXTERNAL HEMORRHOIDECTOMY X 2  SURGEON:  Surgeon(s): Karie Soda, MD  ASSISTANT: none   ANESTHESIA:     Anorectal block with liposomal bupivicaine local and general  EBL:  Total I/O In: -  Out: 100 [Blood:100]  Delay start of Pharmacological VTE agent (>24hrs) due to surgical blood loss or risk of bleeding:  no  DRAINS: none   SPECIMEN:  Source of Specimen:  Int/ext hemorrhoids (right lateral & left posteriolateral)  DISPOSITION OF SPECIMEN:  PATHOLOGY  COUNTS:  YES  PLAN OF CARE: Discharge to home after PACU  PATIENT DISPOSITION:  PACU - hemodynamically stable.  INDICATION:   Active young male with very symptomatic hemorrhoids with anemia and pain.  I recommended hemorrhoidal ligation and removal.  The anatomy & physiology of the anorectal region was discussed.  The pathophysiology of hemorrhoids and differential diagnosis was discussed.  Natural history risks without surgery was discussed.   I stressed the importance of a bowel regimen to have daily soft bowel movements to minimize progression of disease.  Interventions such as sclerotherapy & banding were discussed.  The patient's symptoms are not adequately controlled by medicines and other non-operative treatments.  I feel the risks & problems of no surgery outweigh the operative risks; therefore, I recommended surgery to treat the hemorrhoids by ligation, pexy, and possible resection.  Risks such as bleeding, infection, urinary difficulties, need for further treatment, heart attack, death, and  other risks were discussed.   I noted a good likelihood this will help address the problem.  Goals of post-operative recovery were discussed as well.  Possibility that this will not correct all symptoms was explained.  Post-operative pain, bleeding, constipation, and other problems after surgery were discussed.  We will work to minimize complications.   Educational handouts further explaining the pathology, treatment options, and bowel regimen were given as well.  Questions were answered.  The patient expresses understanding & wishes to proceed with surgery.   OR FINDINGS: Very large internal hemorrhoid piles.  Reversed anatomy.  Left lateral greater than right posterior lateral greater than right anterior.  Significant left posterior greater than right lateral enternal hemorrhoids  DESCRIPTION:   Informed consent was confirmed. Patient underwent general anesthesia without difficulty. Patient was placed into prone positioning.  The perianal region was prepped and draped in sterile fashion. Surgical time-out confirmed our plan.  I did digital rectal examination and then transitioned over to anoscopy to get a sense of the anatomy.  I switched over to the [facility] fiberoptically lit Doppler anocope.   Using the Doppler on the tip of the THD anoscope, I identified the arterial hemorrhoidal vessels coming in in the classic hexagonal anatomical pattern  (right posterior/lateral/anterior, left posterior /lateral/anterior).    I proceeded to ligate the hemorrhoidal arteries. I used a 2-0 Vicryl suture on a UR-6 needle in a figure-of-eight fashion over the signal around 6 cm proximal to the anal verge. I then ran that stitch longitudinally more distally to the white line of [place]. I then tied that stitch down to cause a hemorrhoidopexy. I did that for all 6 locations.  I had to excise right lateral and left posterior  lateral redundant enlarged internal and external hemorrhoidal tissue during that.  I redid Doppler  anoscopy. I Identified a signal at the left anterior location.  I isolated and ligated this with a figure-of-eight stitch. Signals went away.  At completion of this, all hemorrhoids were reduced into the rectum.  There is no more prolapse.  Did excise some external hemorrhoid tissue and closed those wounds with 3-0 chromic suture.  At helped external anatomy to look morenormal.  I repeated anoscopy and examination.  Hemostasis was good.  Patient is being extubated go to recovery room.  I had discussed postop care in detail with the patient in the preop holding area.  Instructions are written.  I am about to discuss the patient's status to the family.     Ardeth SportsmanSteven C. Marketta Valadez, M.D., F.A.C.S. Gastrointestinal and Minimally Invasive Surgery [facility] [address] [place], [place] [address] [phone] Main / Paging

## 2014-06-29 NOTE — Anesthesia Preprocedure Evaluation (Signed)
Anesthesia Evaluation    Reviewed: Allergy & Precautions, NPO status , Patient's Chart, lab work & pertinent test results  History of Anesthesia Complications Negative for: history of anesthetic complications  Airway        Dental   Pulmonary neg pulmonary ROS,          Cardiovascular hypertension,     Neuro/Psych PSYCHIATRIC DISORDERS Anxiety negative neurological ROS     GI/Hepatic Neg liver ROS, PUD,   Endo/Other  negative endocrine ROS  Renal/GU Renal InsufficiencyRenal disease     Musculoskeletal   Abdominal   Peds  Hematology   Anesthesia Other Findings   Reproductive/Obstetrics                             Anesthesia Physical Anesthesia Plan  ASA: II  Anesthesia Plan: General   Post-op Pain Management:    Induction: Intravenous  Airway Management Planned: Oral ETT  Additional Equipment:   Intra-op Plan:   Post-operative Plan: Extubation in OR  Informed Consent:   Plan Discussed with:   Anesthesia Plan Comments:         Anesthesia Quick Evaluation

## 2014-06-29 NOTE — Discharge Instructions (Signed)
ANORECTAL SURGERY:  POST OPERATIVE INSTRUCTIONS  1. Take your usually prescribed home medications unless otherwise directed. 2. DIET: Follow a light bland diet the first 24 hours after arrival home, such as soup, liquids, crackers, etc.  Be sure to include lots of fluids daily.  Avoid fast food or heavy meals as your are more likely to get nauseated.  Eat a low fat the next few days after surgery.   3. PAIN CONTROL: a. Pain is best controlled by a usual combination of three different methods TOGETHER: i. Ice/Heat ii. Over the counter pain medication iii. Prescription pain medication b. Most patients will experience some swelling and discomfort in the anus/rectal area. and incisions.  Ice packs or heat (30-60 minutes up to 6 times a day) will help. Use ice for the first few days to help decrease swelling and bruising, then switch to heat such as warm towels, sitz baths, warm baths, etc to help relax tight/sore spots and speed recovery.  Some people prefer to use ice alone, heat alone, alternating between ice & heat.  Experiment to what works for you.  Swelling and bruising can take several weeks to resolve.   c. It is helpful to take an over-the-counter pain medication regularly for the first few weeks.  Choose one of the following that works best for you: i. Naproxen (Aleve, etc)  Two 261m tabs twice a day ii. Ibuprofen (Advil, etc) Three 2015mtabs four times a day (every meal & bedtime) iii. Acetaminophen (Tylenol, etc) 500-65049mour times a day (every meal & bedtime) d. A  prescription for pain medication (such as oxycodone, hydrocodone, etc) should be given to you upon discharge.  Take your pain medication as prescribed.  i. If you are having problems/concerns with the prescription medicine (does not control pain, nausea, vomiting, rash, itching, etc), please call us [phone] see if we need to switch you to a different pain medicine that will work better for you and/or control your  side effect better. ii. If you need a refill on your pain medication, please contact your pharmacy.  They will contact our office to request authorization. Prescriptions will not be filled after 5 pm or on week-ends.  Use a Sitz Bath 4-8 times a day for relief A sitz bath is a warm water bath taken in the sitting position that covers only the hips and buttocks. It may be used for either healing or hygiene purposes. Sitz baths are also used to relieve pain, itching, or muscle spasms. The water may contain medicine. Moist heat will help you heal and relax.  HOME CARE INSTRUCTIONS  Take 3 to 4 sitz baths a day. 1. Fill the bathtub half full with warm water. 2. Sit in the water and open the drain a little. 3. Turn on the warm water to keep the tub half full. Keep the water running constantly. 4. Soak in the water for 15 to 20 minutes. 5. After the sitz bath, pat the affected area dry first. SEEK MEDICAL CARE IF:  You get worse instead of better. Stop the sitz baths if you get worse.   4. KEEP YOUR BOWELS REGULAR a. The goal is one bowel movement a day b. Avoid getting constipated.  Between the surgery and the pain medications, it is common to experience some constipation.  Increasing fluid intake and taking a fiber supplement (such as Metamucil, Citrucel, FiberCon, MiraLax, etc) 1-2 times a day regularly will usually help prevent this problem from occurring.  A  mild laxative (prune juice, Milk of Magnesia, MiraLax, etc) should be taken according to package directions if there are no bowel movements after 48 hours. c. Watch out for diarrhea.  If you have many loose bowel movements, simplify your diet to bland foods & liquids for a few days.  Stop any stool softeners and decrease your fiber supplement.  Switching to mild anti-diarrheal medications (Kayopectate, Pepto Bismol) can help.  If this worsens or does not improve, please call us.  5. Wound Care a. Remove your bandages the day after surgery.   Unless discharge instructions indicate otherwise, leave your bandage dry and in place overnight.  Remove the bandage during your first bowel movement.   b. Allow the wound packing to fall out over the next few days.  You can trim exposed gauze / ribbon as it falls out.  You do not need to repack the wound unless instructed otherwise.  Wear an absorbent pad or soft cotton gauze in your underwear as needed to catch any drainage and help keep the area  c. Keep the area clean and dry.  Bathe / shower every day.  Keep the area clean by showering / bathing over the incision / wound.   It is okay to soak an open wound to help wash it.  Wet wipes or showers / gentle washing after bowel movements is often less traumatic than regular toilet paper. d. Dennis Bast may have some styrofoam-like soft packing in the rectum which will come out with the first bowel movement.  e. You will often notice bleeding with bowel movements.  This should slow down by the end of the first week of surgery f. Expect some drainage.  This should slow down, too, by the end of the first week of surgery.  Wear an absorbent pad or soft cotton gauze in your underwear until the drainage stops. 6. ACTIVITIES as tolerated:   a. You may resume regular (light) daily activities beginning the next day--such as daily self-care, walking, climbing stairs--gradually increasing activities as tolerated.  If you can walk 30 minutes without difficulty, it is safe to try more intense activity such as jogging, treadmill, bicycling, low-impact aerobics, swimming, etc. b. Save the most intensive and strenuous activity for last such as sit-ups, heavy lifting, contact sports, etc  Refrain from any heavy lifting or straining until you are off narcotics for pain control.   c. DO NOT PUSH THROUGH PAIN.  Let pain be your guide: If it hurts to do something, don't do it.  Pain is your body warning you to avoid that activity for another week until the pain goes down. d. You may  drive when you are no longer taking prescription pain medication, you can comfortably sit for long periods of time, and you can safely maneuver your car and apply brakes. e. Dennis Bast may have sexual intercourse when it is comfortable.  7. FOLLOW UP in our office a. Please call [facility] at [phone] to set up an appointment to see your surgeon in the office for a follow-up appointment approximately 2 weeks after your surgery. b. Make sure that you call for this appointment the day you arrive home to insure a convenient appointment time. 10. IF YOU HAVE DISABILITY OR FAMILY LEAVE FORMS, BRING THEM TO THE OFFICE FOR PROCESSING.  DO NOT GIVE THEM TO YOUR DOCTOR.        WHEN TO CALL us [phone]: 1. Poor pain control 2. Reactions / problems with new medications (rash/itching, nausea, etc)  3. Fever over 101.5 F (38.5 C) 4. Inability to urinate 5. Nausea and/or vomiting 6. Worsening swelling or bruising 7. Continued bleeding from incision. 8. Increased pain, redness, or drainage from the incision  The clinic staff is available to answer your questions during regular business hours (8:30am-5pm).  Please dont hesitate to call and ask to speak to one of our nurses for clinical concerns.   A surgeon from [facility] is always on call at the hospitals   If you have a medical emergency, go to the nearest emergency room or call 911.    [facility] [address], [place], [place]  [address] ? MAIN: [phone] ? TOLL FREE: [phone] ? FAX [phone] [email or web address] [address], [place], [place]  [address] ? MAIN: [phone] ? TOLL FREE: [phone] ?  FAX [phone] [email or web address]  Managing Pain  Pain after surgery or related to activity is often due to strain/injury to muscle, tendon, nerves and/or incisions.  This pain is usually  short-term and will improve in a few months.   Many people find it helpful to do the following things TOGETHER to help speed the process of healing and to get back to regular activity more quickly:  1. Avoid heavy physical activity a.  no lifting greater than 20 pounds b. Do not push through the pain.  Listen to your body and avoid positions and maneuvers than reproduce the pain c. Walking is okay as tolerated, but go slowly and stop when getting sore.  d. Remember: If it hurts to do it, then dont do it! 2. Take Anti-inflammatory medication  a. Take with food/snack around the clock for 1-2 weeks i. This helps the muscle and nerve tissues become less irritable and calm down faster b. Choose ONE of the following over-the-counter medications: i. Naproxen  tabs (ex. Aleve) 1-2 pills twice a day  ii. Ibuprofen  tabs (ex. Advil, Motrin) 3-4 pills with every meal and just before bedtime iii. Acetaminophen  tabs (Tylenol) 1-2 pills with every meal and just before bedtime 3. Use a Heating pad or Ice/Cold Pack a. 4-6 times a day b. May use warm bath/hottub  or showers 4. Try Gentle Massage and/or Stretching  a. at the area of pain many times a day b. stop if you feel pain - do not overdo it  Try these steps together to help you body heal faster and avoid making things get worse.  Doing just one of these things may not be enough.    If you are not getting better after two weeks or are noticing you are getting worse, contact our office for further advice; we may need to re-evaluate you & see what other things we can do to help.  GETTING TO GOOD BOWEL HEALTH. Irregular bowel habits such as constipation and diarrhea can lead to many problems over time.  Having one soft bowel movement a day is the most important way to prevent further problems.  The anorectal canal is designed to handle stretching and feces to safely manage our ability to get rid of solid waste (feces, poop, stool) out  of our body.  BUT, hard constipated stools can act like ripping concrete bricks and diarrhea can be a burning fire to this very sensitive area of our body, causing inflamed hemorrhoids, anal fissures, increasing risk is perirectal abscesses, abdominal pain/bloating, an making irritable bowel worse.  The goal: ONE SOFT BOWEL MOVEMENT A DAY!  To have soft, regular bowel movements:   Drink at least 8 tall glasses of water a day.    Take plenty of fiber.  Fiber is the undigested part of plant food that passes into the colon, acting s natures broom to encourage bowel motility and movement.  Fiber can absorb and hold large amounts of water. This results in a larger, bulkier stool, which is soft and easier to pass. Work gradually over several weeks up to 6 servings a day of fiber (25g a day even more if needed) in the form of: o Vegetables -- Root (potatoes, carrots, turnips), leafy green (lettuce, salad greens, celery, spinach), or cooked high residue (cabbage, broccoli, etc) o Fruit -- Fresh (unpeeled skin & pulp), Dried (prunes, apricots, cherries, etc ),  or stewed ( applesauce)  o Whole grain breads, pasta, etc (whole wheat)  o Bran cereals   Bulking Agents -- This type of water-retaining fiber generally is easily obtained each day by one of the following:  o Psyllium bran -- The psyllium plant is remarkable because its ground seeds can retain so much water. This product is available as Metamucil, Konsyl, Effersyllium, Per Diem Fiber, or the less expensive generic preparation in drug and health food stores. Although labeled a laxative, it really is not a laxative.  o Methylcellulose -- This is another fiber derived from wood which also retains water. It is available as Citrucel. o Polyethylene Glycol - and artificial fiber commonly called Miralax or Glycolax.  It is helpful for people with gassy or bloated feelings with regular fiber o Flax Seed - a less gassy fiber than psyllium  No reading or  other relaxing activity while on the toilet. If bowel movements take longer than 5 minutes, you are too constipated  AVOID CONSTIPATION.  High fiber and water intake usually takes care of this.  Sometimes a laxative is needed to stimulate more frequent bowel movements, but   Laxatives are not a good long-term solution as it can wear the colon out. o Osmotics (Milk of Magnesia, Fleets phosphosoda, Magnesium citrate, MiraLax, GoLytely) are safer than  o Stimulants (Senokot, Castor Oil, Dulcolax, Ex Lax)    o Do not take laxatives for more than 7days in a row.   IF SEVERELY CONSTIPATED, try a Bowel Retraining Program: o Do not use laxatives.  o Eat a diet high in roughage, such as bran cereals and leafy vegetables.  o Drink six (6) ounces of prune or apricot juice each morning.  o Eat two (2) large servings of stewed fruit each day.  o Take one (1) heaping tablespoon of a psyllium-based bulking agent twice a day. Use sugar-free sweetener when possible to avoid excessive calories.  o Eat a normal breakfast.  o Set aside 15 minutes after breakfast to sit on the toilet, but do not strain to have a bowel movement.  o If you do not have a bowel movement by the third day, use an enema and repeat the above steps.   Controlling diarrhea o Switch to liquids and simpler foods for a few days to avoid stressing your intestines further. o Avoid dairy products (especially milk & ice cream) for a short time.  The intestines often can lose the ability to digest lactose when stressed. o Avoid foods that cause gassiness or bloating.  Typical foods include beans and other legumes, cabbage, broccoli, and dairy foods.  Every person has some sensitivity to other foods, so  listen to our body and avoid those foods that trigger problems for you. o Adding fiber (Citrucel, Metamucil, psyllium, Miralax) gradually can help thicken stools by absorbing excess fluid and retrain the intestines to act more normally.  Slowly  increase the dose over a few weeks.  Too much fiber too soon can backfire and cause cramping & bloating. o Probiotics (such as active yogurt, Align, etc) may help repopulate the intestines and colon with normal bacteria and calm down a sensitive digestive tract.  Most studies show it to be of mild help, though, and such products can be costly. o Medicines: - Bismuth subsalicylate (ex. Kayopectate, Pepto Bismol) every 30 minutes for up to 6 doses can help control diarrhea.  Avoid if pregnant. - Loperamide (Immodium) can slow down diarrhea.  Start with two tablets (  total) first and then try one tablet every 6 hours.  Avoid if you are having fevers or severe pain.  If you are not better or start feeling worse, stop all medicines and call your doctor for advice o Call your doctor if you are getting worse or not better.  Sometimes further testing (cultures, endoscopy, X-ray studies, bloodwork, etc) may be needed to help diagnose and treat the cause of the diarrhea.   Post Anesthesia Home Care Instructions  Activity: Get plenty of rest for the remainder of the day. A responsible adult should stay with you for 24 hours following the procedure.  For the next 24 hours, DO NOT: -Drive a car -Advertising copywriter -Drink alcoholic beverages -Take any medication unless instructed by your physician -Make any legal decisions or sign important papers.  Meals: Start with liquid foods such as gelatin or soup. Progress to regular foods as tolerated. Avoid greasy, spicy, heavy foods. If nausea and/or vomiting occur, drink only clear liquids until the nausea and/or vomiting subsides. Call your physician if vomiting continues.  Special Instructions/Symptoms: Your throat may feel dry or sore from the anesthesia or the breathing tube placed in your throat during surgery. If this causes discomfort, gargle with warm salt water. The discomfort should disappear within 24 hours.

## 2014-06-30 ENCOUNTER — Encounter (HOSPITAL_COMMUNITY): Payer: Self-pay | Admitting: Surgery

## 2015-04-07 ENCOUNTER — Other Ambulatory Visit: Payer: Self-pay | Admitting: Sports Medicine

## 2015-04-07 DIAGNOSIS — M25511 Pain in right shoulder: Secondary | ICD-10-CM

## 2015-04-24 ENCOUNTER — Ambulatory Visit
Admission: RE | Admit: 2015-04-24 | Discharge: 2015-04-24 | Disposition: A | Discharge status: Home / Self Care | Payer: 59 | Source: Ambulatory Visit | Attending: Sports Medicine | Admitting: Sports Medicine

## 2015-04-24 DIAGNOSIS — M25511 Pain in right shoulder: Secondary | ICD-10-CM

## 2015-09-16 ENCOUNTER — Other Ambulatory Visit: Payer: Self-pay | Admitting: Sports Medicine

## 2015-09-16 DIAGNOSIS — M545 Low back pain: Secondary | ICD-10-CM

## 2015-09-20 ENCOUNTER — Other Ambulatory Visit: Payer: 59

## 2015-09-26 ENCOUNTER — Inpatient Hospital Stay: Admission: RE | Admit: 2015-09-26 | Payer: 59 | Source: Ambulatory Visit

## 2016-05-09 ENCOUNTER — Ambulatory Visit: Payer: Self-pay | Admitting: Surgery

## 2016-05-09 NOTE — H&P (Signed)
Luke Kim 05/09/2016 9:04 AM Location: [facility] Patient #: [id number] DOB: 10-22-1970 Married / Language: English / Race: White Male  History of Present Illness [name](Jamariah Tony C. Dariyah Garduno MD; 05/09/2016 1:21 PM) The patient is a 45 year old male who presents with an abdominal wall hernia. Note for "Abdominal hernia": Patient sent for surgical consultation by his primary care physician, Dr. Molly Maduroobert eating her. Concern for symptomatically umbilical hernia.  Pleasant muscular active male. Had problems with bleeding and painful hemorrhoids. Route after hemorrhoid surgery. Struggled soreness but got better. Feeling much better now. His bowels better. He is noted increasing swollen lump at his bellybutton. Concern for umbilical hernia. He comes a with his wife. He feels like it carefully gotten larger. Having episodes of occasional sharp pain as well. Concerned him. Mentioned to his primary care physician. Surgical consultation recommended.  He remains very physically active. Does use weightlifting a fair amount. Has backed off given the lump and discomfort. No personal nor family history of GI/colon cancer, inflammatory bowel disease, irritable bowel syndrome, allergy such as Celiac Sprue, dietary/dairy problems, colitis, ulcers nor gastritis. No recent sick contacts/gastroenteritis. No travel outside the country. No changes in diet. No dysphagia to solids or liquids. No significant heartburn or reflux. No hematochezia, hematemesis, coffee ground emesis. No evidence of prior gastric/peptic ulceration.   Problem List/Past Medical [name](Wileen Duncanson C. Helina Hullum, MD; 05/09/2016 9:22 AM) ANEMIA, DEFICIENCY (D53.9) HEMORRHOIDS (K64.9) PROLAPSED INTERNAL HEMORRHOIDS, GRADE 3 (N02.7(K64.2)  Past Surgical History [name](Milos Milligan C. Annsley Akkerman, MD; 05/09/2016 9:22 AM) No pertinent past surgical history  Diagnostic Studies History [name](Annalise Mcdiarmid C. Gladiola Madore, MD; 05/09/2016 9:22 AM) Colonoscopy within last  year  Allergies (April Staton, CMA; 05/09/2016 9:05 AM) No Known Drug Allergies 04/29/2014  Medication History (April Staton, CMA; 05/09/2016 9:05 AM) Anusol-HC (2.5% Cream, 1 (one) Cream Rectal four times daily, as needed, Taken starting 06/16/2014) Active. ALPRAZolam (1MG  Tablet, Oral) Active. Zolpidem Tartrate (10MG  Tablet, Oral) Active. AmLODIPine Besylate (10MG  Tablet, Oral) Active. Docusate Sodium (100MG  Capsule, Oral) Active. HydrOXYzine HCl (50MG  Tablet, Oral) Active. Pantoprazole Sodium (40MG  Tablet DR, Oral) Active. Benazepril HCl (20MG  Tablet, Oral) Active. Medications Reconciled  Social History [name](Anniebell Bedore C. Tameya Kuznia, MD; 05/09/2016 9:22 AM) Alcohol use Occasional alcohol use. Caffeine use Coffee. Tobacco use Never smoker.  Family History [name](Briyah Wheelwright C. Haydn Hutsell, MD; 05/09/2016 9:22 AM) Depression Mother. Hypertension Father, Mother. Ischemic Bowel Disease Brother, Father.  Other Problems [name](Izyk Marty C. Korynne Dols, MD; 05/09/2016 9:22 AM) Anxiety Disorder Gastroesophageal Reflux Disease High blood pressure Unspecified Diagnosis    Vitals (April Staton CMA; 05/09/2016 9:06 AM) 05/09/2016 9:05 AM Weight: 278 lb Height: 71in Body Surface Area: 2.43 m Body Mass Index: 38.77 kg/m  Temp.: 98.23F(Oral)  Pulse: 95 (Regular)  BP: 140/115 (Sitting, Left Arm, Standard)      Physical Exam [name](Dewie Ahart C. Syanna Remmert MD; 05/09/2016 9:40 AM)  General Mental Status-Alert. General Appearance-Not in acute distress. Voice-Normal.  Integumentary Global Assessment Normal Exam - Distribution of scalp and body hair is normal. General Characteristics Overall Skin Surface - no rashes and no suspicious lesions.  Head and Neck Head-normocephalic, atraumatic with no lesions or palpable masses. Face Global Assessment - atraumatic, no absence of expression. Neck Global Assessment - no abnormal movements, no decreased range of  motion. Trachea-midline. Thyroid Gland Characteristics - non-tender.  Eye Eyeball - Left-Extraocular movements intact, No Nystagmus. Eyeball - Right-Extraocular movements intact, No Nystagmus. Upper Eyelid - Left-No Cyanotic. Upper Eyelid - Right-No Cyanotic.  Chest and Lung Exam Inspection Accessory muscles - No use of accessory muscles in breathing.  Abdomen Note: 6 x 5 cm periumbilical mass sensitive but reducible consistent with ventral hernia. Mild diastasis.   Abdomen obese but soft. Nontender, nondistended. No guarding. No umbilical nor other hernias  Rectal Note: Perianal skin clean. Radial incisions with mild maceration but mostly closed. Sensitive but no cellulitis or abscess.  Peripheral Vascular Upper Extremity Inspection - Left - Not Gangrenous, No Petechiae. Right - Not Gangrenous, No Petechiae.  Neurologic Neurologic evaluation reveals -normal attention span and ability to concentrate, able to name objects and repeat phrases. Appropriate fund of knowledge and normal coordination.  Neuropsychiatric Mental status exam performed with findings of-able to articulate well with normal speech/language, rate, volume and coherence and no evidence of hallucinations, delusions, obsessions or homicidal/suicidal ideation. Orientation-oriented X3.  Musculoskeletal Global Assessment Gait and Station - normal gait and station.  Lymphatic General Lymphatics Description - No Generalized lymphadenopathy.    Assessment & Plan [name] MD; 05/09/2016 9:39 AM)  VENTRAL HERNIA WITHOUT OBSTRUCTION OR GANGRENE (K43.9) Impression: Enlarging and more symptomatic periumbilical ventral hernia with some mild diastases.  I think he would benefit from surgical repair. Given his young age, obesity, very muscular build; I think standard of care would be mesh underlay repair to minimize recurrence. He was nervous about mesh but seemed reassured after I  explained reasonings abusing the mesh is that it markedly reduces recurrence rate.  I did caution them he will have pain and soreness. Hopefully [place] intense with his hemorrhoid surgery. Diluted seem to work best for him as he had issues with other narcotics.  PREOP - [facility] - ENCOUNTER FOR PREOPERATIVE EXAMINATION FOR GENERAL SURGICAL PROCEDURE (Z01.818)  Current Plans You are being scheduled for surgery- Our schedulers will call you.  You should hear from our office's scheduling department within 5 working days about the location, date, and time of surgery. We try to make accommodations for patient's preferences in scheduling surgery, but sometimes the OR schedule or the surgeon's schedule prevents [place] from making those accommodations.  If you have not heard from our office [phone]) in 5 working days, call the office and ask for your surgeon's nurse.  If you have other questions about your diagnosis, plan, or surgery, call the office and ask for your surgeon's nurse.  Written instructions provided The anatomy & physiology of the abdominal wall was discussed. The pathophysiology of hernias was discussed. Natural history risks without surgery including progeressive enlargement, pain, incarceration, & strangulation was discussed. Contributors to complications such as smoking, obesity, diabetes, prior surgery, etc were discussed.  I feel the risks of no intervention will lead to serious problems that outweigh the operative risks; therefore, I recommended surgery to reduce and repair the hernia. I explained laparoscopic techniques with possible need for an open approach. I noted the probable use of mesh to patch and/or buttress the hernia repair  Risks such as bleeding, infection, abscess, need for further treatment, heart attack, death, and other risks were discussed. I noted a good likelihood this will help address the problem. Goals of post-operative recovery were discussed as  well. Possibility that this will not correct all symptoms was explained. I stressed the importance of low-impact activity, aggressive pain control, avoiding constipation, & not pushing through pain to minimize risk of post-operative chronic pain or injury. Possibility of reherniation especially with smoking, obesity, diabetes, immunosuppression, and other health conditions was discussed. We will work to minimize complications.  An educational handout further explaining the pathology & treatment options was given as well. Questions were answered.  The patient expresses understanding & wishes to proceed with surgery.  Pt Education - [facility] Hernia Post-Op HCI (Kili Gracy): discussed with patient and provided information. Pt Education - [facility] Pain Control (Thresea Doble) Pt Education - Pamphlet Given - Laparoscopic Hernia Repair: discussed with patient and provided information. PROLAPSED INTERNAL HEMORRHOIDS, GRADE 3 (K64.2) Impression: I'm glad that he is not having more hemorrhoidal pain or bleeding.  Recommend he up his fiber supplement to help counteract his intermittent constipation. He did confess he's not totally compliant with fiber every day but tries to. Related to keep his bowels soft and regular to minimize new hemorrhoid issues.  Current Plans Pt Education - [facility] Hemorrhoids (Blaze Nylund)  Ardeth SportsmanSteven C. Viola Placeres, M.D., F.A.C.S. Gastrointestinal and Minimally Invasive Surgery [facility] [address] [place] [id number] [phone] Main / Paging

## 2016-06-30 DIAGNOSIS — K439 Ventral hernia without obstruction or gangrene: Secondary | ICD-10-CM | POA: Diagnosis not present

## 2017-01-10 DIAGNOSIS — I10 Essential (primary) hypertension: Secondary | ICD-10-CM | POA: Diagnosis not present

## 2017-01-10 DIAGNOSIS — E7800 Pure hypercholesterolemia, unspecified: Secondary | ICD-10-CM | POA: Diagnosis not present

## 2017-01-11 ENCOUNTER — Emergency Department (HOSPITAL_COMMUNITY)
Admission: EM | Admit: 2017-01-11 | Discharge: 2017-01-11 | Disposition: A | Discharge status: Home / Self Care | Payer: 59 | Attending: Emergency Medicine | Admitting: Emergency Medicine

## 2017-01-11 ENCOUNTER — Encounter (HOSPITAL_COMMUNITY): Payer: Self-pay | Admitting: Emergency Medicine

## 2017-01-11 ENCOUNTER — Emergency Department (HOSPITAL_COMMUNITY): Payer: 59

## 2017-01-11 DIAGNOSIS — S301XXA Contusion of abdominal wall, initial encounter: Secondary | ICD-10-CM | POA: Diagnosis not present

## 2017-01-11 DIAGNOSIS — S3991XA Unspecified injury of abdomen, initial encounter: Secondary | ICD-10-CM | POA: Diagnosis not present

## 2017-01-11 DIAGNOSIS — R1030 Lower abdominal pain, unspecified: Secondary | ICD-10-CM | POA: Insufficient documentation

## 2017-01-11 DIAGNOSIS — Y9389 Activity, other specified: Secondary | ICD-10-CM | POA: Diagnosis not present

## 2017-01-11 DIAGNOSIS — Y999 Unspecified external cause status: Secondary | ICD-10-CM | POA: Diagnosis not present

## 2017-01-11 DIAGNOSIS — I10 Essential (primary) hypertension: Secondary | ICD-10-CM | POA: Insufficient documentation

## 2017-01-11 DIAGNOSIS — Y92410 Unspecified street and highway as the place of occurrence of the external cause: Secondary | ICD-10-CM | POA: Insufficient documentation

## 2017-01-11 DIAGNOSIS — Z79899 Other long term (current) drug therapy: Secondary | ICD-10-CM | POA: Insufficient documentation

## 2017-01-11 DIAGNOSIS — S50812A Abrasion of left forearm, initial encounter: Secondary | ICD-10-CM | POA: Diagnosis not present

## 2017-01-11 DIAGNOSIS — S299XXA Unspecified injury of thorax, initial encounter: Secondary | ICD-10-CM | POA: Diagnosis not present

## 2017-01-11 LAB — COMPREHENSIVE METABOLIC PANEL
ALT: 44 U/L (ref 17–63)
AST: 84 U/L — AB (ref 15–41)
Albumin: 4 g/dL (ref 3.5–5.0)
Alkaline Phosphatase: 63 U/L (ref 38–126)
Anion gap: 9 (ref 5–15)
BUN: 21 mg/dL — AB (ref 6–20)
CHLORIDE: 104 mmol/L (ref 101–111)
CO2: 22 mmol/L (ref 22–32)
Calcium: 9.7 mg/dL (ref 8.9–10.3)
Creatinine, Ser: 1.34 mg/dL — ABNORMAL HIGH (ref 0.61–1.24)
GFR calc Af Amer: >60 mL/min (ref >60)
Glucose, Bld: 87 mg/dL (ref 65–99)
POTASSIUM: 4.3 mmol/L (ref 3.5–5.1)
Sodium: 135 mmol/L (ref 135–145)
Total Bilirubin: 0.9 mg/dL (ref 0.3–1.2)
Total Protein: 7 g/dL (ref 6.5–8.1)

## 2017-01-11 LAB — CBC WITH DIFFERENTIAL/PLATELET
BASOS ABS: 0 10*3/uL (ref 0.0–0.1)
BASOS PCT: 0 %
EOS PCT: 0 %
Eosinophils Absolute: 0 10*3/uL (ref 0.0–0.7)
HCT: 47.5 % (ref 39.0–52.0)
Hemoglobin: 16.6 g/dL (ref 13.0–17.0)
LYMPHS PCT: 7 %
Lymphs Abs: 0.8 10*3/uL (ref 0.7–4.0)
MCH: 29.9 pg (ref 26.0–34.0)
MCHC: 34.9 g/dL (ref 30.0–36.0)
MCV: 85.4 fL (ref 78.0–100.0)
MONO ABS: 0.6 10*3/uL (ref 0.1–1.0)
Monocytes Relative: 6 %
Neutro Abs: 9.7 10*3/uL — ABNORMAL HIGH (ref 1.7–7.7)
Neutrophils Relative %: 87 %
PLATELETS: 184 10*3/uL (ref 150–400)
RBC: 5.56 MIL/uL (ref 4.22–5.81)
RDW: 13.6 % (ref 11.5–15.5)
WBC: 11.2 10*3/uL — ABNORMAL HIGH (ref 4.0–10.5)

## 2017-01-11 LAB — URINALYSIS, ROUTINE W REFLEX MICROSCOPIC
Bacteria, UA: NONE SEEN
Bilirubin Urine: NEGATIVE
Glucose, UA: NEGATIVE mg/dL
HGB URINE DIPSTICK: NEGATIVE
Ketones, ur: 5 mg/dL — AB
LEUKOCYTES UA: NEGATIVE
NITRITE: NEGATIVE
PROTEIN: 30 mg/dL — AB
SPECIFIC GRAVITY, URINE: 1.019 (ref 1.005–1.030)
SQUAMOUS EPITHELIAL / LPF: NONE SEEN
WBC, UA: NONE SEEN WBC/hpf (ref 0–5)
pH: 5 (ref 5.0–8.0)

## 2017-01-11 LAB — RAPID URINE DRUG SCREEN, HOSP PERFORMED
AMPHETAMINES: NOT DETECTED
BENZODIAZEPINES: POSITIVE — AB
Barbiturates: NOT DETECTED
COCAINE: NOT DETECTED
OPIATES: NOT DETECTED
TETRAHYDROCANNABINOL: NOT DETECTED

## 2017-01-11 LAB — ETHANOL: Alcohol, Ethyl (B): <5 mg/dL (ref <5)

## 2017-01-11 LAB — LIPASE, BLOOD: LIPASE: 23 U/L (ref 11–51)

## 2017-01-11 MED ORDER — SODIUM CHLORIDE 0.9 % IV BOLUS (SEPSIS)
1000.0000 mL | Freq: Once | INTRAVENOUS | Status: AC
  Administered 2017-01-11: 1000 mL via INTRAVENOUS

## 2017-01-11 MED ORDER — FENTANYL CITRATE (PF) 100 MCG/2ML IJ SOLN
25.0000 ug | Freq: Once | INTRAMUSCULAR | Status: AC
  Administered 2017-01-11: 25 ug via INTRAVENOUS
  Filled 2017-01-11: qty 2

## 2017-01-11 MED ORDER — IOPAMIDOL (ISOVUE-300) INJECTION 61%
INTRAVENOUS | Status: AC
  Administered 2017-01-11: 100 mL
  Filled 2017-01-11: qty 100
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
## 2017-01-11 NOTE — ED Notes (Signed)
Pt taken to CT scan.

## 2017-01-25 DIAGNOSIS — E291 Testicular hypofunction: Secondary | ICD-10-CM | POA: Diagnosis not present

## 2017-03-05 DIAGNOSIS — E291 Testicular hypofunction: Secondary | ICD-10-CM | POA: Diagnosis not present

## 2017-03-22 DIAGNOSIS — E291 Testicular hypofunction: Secondary | ICD-10-CM | POA: Diagnosis not present

## 2017-06-04 DIAGNOSIS — M25512 Pain in left shoulder: Secondary | ICD-10-CM | POA: Diagnosis not present

## 2017-07-16 DIAGNOSIS — R52 Pain, unspecified: Secondary | ICD-10-CM | POA: Diagnosis not present

## 2017-07-16 DIAGNOSIS — L0390 Cellulitis, unspecified: Secondary | ICD-10-CM | POA: Diagnosis not present

## 2017-07-17 DIAGNOSIS — R52 Pain, unspecified: Secondary | ICD-10-CM | POA: Diagnosis not present

## 2017-07-17 DIAGNOSIS — L0390 Cellulitis, unspecified: Secondary | ICD-10-CM | POA: Diagnosis not present

## 2017-07-29 DIAGNOSIS — Z9119 Patient's noncompliance with other medical treatment and regimen: Secondary | ICD-10-CM | POA: Diagnosis not present

## 2017-07-29 DIAGNOSIS — J029 Acute pharyngitis, unspecified: Secondary | ICD-10-CM | POA: Diagnosis not present

## 2017-07-29 DIAGNOSIS — R1319 Other dysphagia: Secondary | ICD-10-CM | POA: Diagnosis not present

## 2017-07-29 DIAGNOSIS — R1310 Dysphagia, unspecified: Secondary | ICD-10-CM | POA: Diagnosis not present

## 2017-07-29 DIAGNOSIS — I10 Essential (primary) hypertension: Secondary | ICD-10-CM | POA: Diagnosis not present

## 2017-09-18 DIAGNOSIS — G479 Sleep disorder, unspecified: Secondary | ICD-10-CM | POA: Diagnosis not present

## 2017-11-06 ENCOUNTER — Ambulatory Visit: Payer: 59 | Admitting: Psychiatry

## 2017-11-13 ENCOUNTER — Encounter: Payer: Self-pay | Admitting: Psychiatry

## 2017-11-13 ENCOUNTER — Other Ambulatory Visit: Payer: Self-pay

## 2017-11-13 ENCOUNTER — Ambulatory Visit: Payer: 59 | Admitting: Psychiatry

## 2017-11-13 VITALS — BP 153/92 | HR 94 | Temp 97.9°F | Wt 245.2 lb

## 2017-11-13 DIAGNOSIS — E291 Testicular hypofunction: Secondary | ICD-10-CM | POA: Insufficient documentation

## 2017-11-13 DIAGNOSIS — F3162 Bipolar disorder, current episode mixed, moderate: Secondary | ICD-10-CM

## 2017-11-13 DIAGNOSIS — F410 Panic disorder [episodic paroxysmal anxiety] without agoraphobia: Secondary | ICD-10-CM | POA: Diagnosis not present

## 2017-11-13 DIAGNOSIS — F411 Generalized anxiety disorder: Secondary | ICD-10-CM

## 2017-11-13 DIAGNOSIS — F5105 Insomnia due to other mental disorder: Secondary | ICD-10-CM

## 2017-11-13 DIAGNOSIS — F1320 Sedative, hypnotic or anxiolytic dependence, uncomplicated: Secondary | ICD-10-CM | POA: Diagnosis not present

## 2017-11-13 DIAGNOSIS — G479 Sleep disorder, unspecified: Secondary | ICD-10-CM | POA: Insufficient documentation

## 2017-11-13 DIAGNOSIS — I10 Essential (primary) hypertension: Secondary | ICD-10-CM | POA: Insufficient documentation

## 2017-11-13 DIAGNOSIS — F419 Anxiety disorder, unspecified: Secondary | ICD-10-CM | POA: Insufficient documentation

## 2017-11-13 DIAGNOSIS — F1221 Cannabis dependence, in remission: Secondary | ICD-10-CM

## 2017-11-13 DIAGNOSIS — F1421 Cocaine dependence, in remission: Secondary | ICD-10-CM

## 2017-11-13 DIAGNOSIS — K219 Gastro-esophageal reflux disease without esophagitis: Secondary | ICD-10-CM | POA: Insufficient documentation

## 2017-11-13 DIAGNOSIS — F524 Premature ejaculation: Secondary | ICD-10-CM | POA: Insufficient documentation

## 2017-11-13 MED ORDER — CARIPRAZINE HCL 1.5 MG PO CAPS: 1.5000 mg | ORAL_CAPSULE | Freq: Every day | ORAL | 1 refills | Status: DC

## 2017-11-13 MED ORDER — NORTRIPTYLINE HCL 10 MG PO CAPS: 10.0000 mg | ORAL_CAPSULE | Freq: Every day | ORAL | 1 refills | Status: DC

## 2017-11-13 MED ORDER — PROPRANOLOL HCL 10 MG PO TABS: 10.0000 mg | ORAL_TABLET | Freq: Three times a day (TID) | ORAL | 1 refills | Status: AC | PRN

## 2017-11-13 NOTE — Patient Instructions (Signed)
Nortriptyline capsules What is this medicine? NORTRIPTYLINE (nor TRIP ti leen) is used to treat depression. This medicine may be used for other purposes; ask your health care provider or pharmacist if you have questions. COMMON BRAND NAME(S): Aventyl, Pamelor What should I tell my health care provider before I take this medicine? They need to know if you have any of these conditions: -an alcohol problem -bipolar disorder or schizophrenia -difficulty passing urine, prostate trouble -glaucoma -heart disease or recent heart attack -liver disease -over active thyroid -seizures -thoughts or plans of suicide or a previous suicide attempt or family history of suicide attempt -an unusual or allergic reaction to nortriptyline, other medicines, foods, dyes, or preservatives -pregnant or trying to get pregnant -breast-feeding How should I use this medicine? Take this medicine by mouth with a glass of water. Follow the directions on the prescription label. Take your doses at regular intervals. Do not take it more often than directed. Do not stop taking this medicine suddenly except upon the advice of your doctor. Stopping this medicine too quickly may cause serious side effects or your condition may worsen. A special MedGuide will be given to you by the pharmacist with each prescription and refill. Be sure to read this information carefully each time. Talk to your pediatrician regarding the use of this medicine in children. Special care may be needed. Overdosage: If you think you have taken too much of this medicine contact a poison control center or emergency room at once. NOTE: This medicine is only for you. Do not share this medicine with others. What if I miss a dose? If you miss a dose, take it as soon as you can. If it is almost time for your next dose, take only that dose. Do not take double or extra doses. What may interact with this medicine? Do not take this medicine with any of the  following medications: -arsenic trioxide -certain medicines medicines for irregular heart beat -cisapride -halofantrine -linezolid -MAOIs like Carbex, Eldepryl, Marplan, Nardil, and Parnate -methylene blue (injected into a vein) -other medicines for mental depression -phenothiazines like perphenazine, thioridazine and chlorpromazine -pimozide -probucol -procarbazine -sparfloxacin -St. John's Wort -ziprasidone This medicine may also interact with any of the following medications: -atropine and related drugs like hyoscyamine, scopolamine, tolterodine and others -barbiturate medicines for inducing sleep or treating seizures, such as phenobarbital -cimetidine -medicines for diabetes -medicines for seizures like carbamazepine or phenytoin -reserpine -thyroid medicine This list may not describe all possible interactions. Give your health care provider a list of all the medicines, herbs, non-prescription drugs, or dietary supplements you use. Also tell them if you smoke, drink alcohol, or use illegal drugs. Some items may interact with your medicine. What should I watch for while using this medicine? Tell your doctor if your symptoms do not get better or if they get worse. Visit your doctor or health care professional for regular checks on your progress. Because it may take several weeks to see the full effects of this medicine, it is important to continue your treatment as prescribed by your doctor. Patients and their families should watch out for new or worsening thoughts of suicide or depression. Also watch out for sudden changes in feelings such as feeling anxious, agitated, panicky, irritable, hostile, aggressive, impulsive, severely restless, overly excited and hyperactive, or not being able to sleep. If this happens, especially at the beginning of treatment or after a change in dose, call your health care professional. You may get drowsy or dizzy. Do not drive,   use machinery, or do  anything that needs mental alertness until you know how this medicine affects you. Do not stand or sit up quickly, especially if you are an older patient. This reduces the risk of dizzy or fainting spells. Alcohol may interfere with the effect of this medicine. Avoid alcoholic drinks. Do not treat yourself for coughs, colds, or allergies without asking your doctor or health care professional for advice. Some ingredients can increase possible side effects. Your mouth may get dry. Chewing sugarless gum or sucking hard candy, and drinking plenty of water may help. Contact your doctor if the problem does not go away or is severe. This medicine may cause dry eyes and blurred vision. If you wear contact lenses you may feel some discomfort. Lubricating drops may help. See your eye doctor if the problem does not go away or is severe. This medicine can cause constipation. Try to have a bowel movement at least every 2 to 3 days. If you do not have a bowel movement for 3 days, call your doctor or health care professional. This medicine can make you more sensitive to the sun. Keep out of the sun. If you cannot avoid being in the sun, wear protective clothing and use sunscreen. Do not use sun lamps or tanning beds/booths. What side effects may I notice from receiving this medicine? Side effects that you should report to your doctor or health care professional as soon as possible: -allergic reactions like skin rash, itching or hives, swelling of the face, lips, or tongue -anxious -breathing problems -changes in vision -confusion -elevated mood, decreased need for sleep, racing thoughts, impulsive behavior -eye pain -fast, irregular heartbeat -feeling faint or lightheaded, falls -feeling agitated, angry, or irritable -fever with increased sweating -hallucination, loss of contact with reality -seizures -stiff muscles -suicidal thoughts or other mood changes -tingling, pain, or numbness in the feet or  hands -trouble passing urine or change in the amount of urine -trouble sleeping -unusually weak or tired -vomiting -yellowing of the eyes or skin Side effects that usually do not require medical attention (report to your doctor or health care professional if they continue or are bothersome): -change in sex drive or performance -change in appetite or weight -constipation -dizziness -dry mouth -nausea -tired -tremors -upset stomach This list may not describe all possible side effects. Call your doctor for medical advice about side effects. You may report side effects to FDA at 1-800-FDA-1088. Where should I keep my medicine? Keep out of the reach of children. Store at room temperature between 15 and 30 degrees C (59 and 86 degrees F). Keep container tightly closed. Throw away any unused medicine after the expiration date. NOTE: This sheet is a summary. It may not cover all possible information. If you have questions about this medicine, talk to your doctor, pharmacist, or health care provider.  2018 [employer] Standard (2015-10-08 12:53:08) Cariprazine oral capsules What is this medicine? CARIPRAZINE (car i PRA zeen) is an antipsychotic. It is used to treat schizophrenia or bipolar disorder. Bipolar disorder is also known as manic-depression. This medicine may be used for other purposes; ask your health care provider or pharmacist if you have questions. COMMON BRAND NAME(S): VRAYLAR What should I tell my health care provider before I take this medicine? They need to know if you have any of these conditions: -dementia -diabetes -difficulty swallowing -heart disease -history of breast cancer -kidney disease -liver disease -low blood counts, like low white cell, platelet, or red cell counts -low blood pressure -  Parkinson's disease -seizures -suicidal thoughts, plans, or attempt; a previous suicide attempt by you or a family member -an unusual or allergic reaction to  cariprazine, other medicines, foods, dyes, or preservatives -pregnant or trying to get pregnant -breast-feeding How should I use this medicine? Take this medicine by mouth with a glass of water. Follow the directions on the prescription label. You may take it with or without food. Take your medicine at regular intervals. Do not take it more often than directed. Do not stop taking except on your doctor's advice. Talk to your pediatrician regarding the use of this medicine in children. Special care may be needed. Overdosage: If you think you have taken too much of this medicine contact a poison control center or emergency room at once. NOTE: This medicine is only for you. Do not share this medicine with others. What if I miss a dose? If you miss a dose, take it as soon as you can. If it is almost time for your next dose, take only that dose. Do not take double or extra doses. What may interact with this medicine? Do not take this medicine with any of the following medications: -metoclopramide This medicine may also interact with the following medications: -carbamazepine -certain medicines for depression, anxiety, or psychotic disturbances -certain medicines for sleep -itraconazole -ketoconazole -medicines for blood pressure -rifampin -St John's wort This list may not describe all possible interactions. Give your health care provider a list of all the medicines, herbs, non-prescription drugs, or dietary supplements you use. Also tell them if you smoke, drink alcohol, or use illegal drugs. Some items may interact with your medicine. What should I watch for while using this medicine? Visit your doctor or health care professional for regular checks on your progress. It may be several weeks before you see the full effects of this medicine. Notify your doctor or health care professional if your symptoms get worse, if you have new symptoms, if you are having an unusual effect from this medicine, or if  you feel out of control, very discouraged or think you might harm yourself or others. Do not suddenly stop taking this medicine. You may need to gradually reduce the dose. Ask your doctor or health care professional for advice. You may get dizzy or drowsy. Do not drive, use machinery, or do anything that needs mental alertness until you know how this medicine affects you. Do not stand or sit up quickly, especially if you are an older patient. This reduces the risk of dizzy or fainting spells. Alcohol can increase dizziness and drowsiness. Avoid alcoholic drinks. This medicine may cause dry eyes and blurred vision. If you wear contact lenses you may feel some discomfort. Lubricating drops may help. See your eye doctor if the problem does not go away or is severe. This medicine can reduce the response of your body to heat or cold. Dress warm in cold weather and stay hydrated in hot weather. If possible, avoid extreme temperatures like saunas, hot tubs, very hot or cold showers, or activities that can cause dehydration such as vigorous exercise. Women should inform their doctor if they wish to become pregnant or think they might be pregnant. The effects of this medicine on an unborn child are not known. A registry is available to monitor pregnancy outcomes in pregnant women exposed to this medicine or similar medicines. Talk to your health care professional or pharmacist for more information. What side effects may I notice from receiving this medicine? Side effects that you  should report to your doctor or health care professional as soon as possible: -allergic reactions like skin rash, itching or hives, swelling of the face, lips, or tongue -changes in emotions or moods -confusion -difficulty swallowing -feeling faint or lightheaded, falls -fever or chills, sore throat -inability to control muscle movements in the face, mouth, hands, arms, or legs -increased hunger or thirst -increased  urination -missed or irregular menstrual periods -problems with balance, talking, walking -redness, blistering, peeling or loosening of the skin, including inside the mouth -seizures -stiff muscles -suicidal thoughts or other mood changes -unusually weak or tired Side effects that usually do not require medical attention (report to your doctor or health care professional if they continue or are bothersome): -constipation -dizziness -drowsiness -nausea, vomiting -restlessness -upset stomach -weight gain This list may not describe all possible side effects. Call your doctor for medical advice about side effects. You may report side effects to FDA at 1-800-FDA-1088. Where should I keep my medicine? Keep out of the reach of children. Store at room temperature between 15 and 30 degrees C (59 and 86 degrees F). Protect from light. Throw away any unused medicine after the expiration date. NOTE: This sheet is a summary. It may not cover all possible information. If you have questions about this medicine, talk to your doctor, pharmacist, or health care provider.  2018 [employer] Standard (2016-04-07 15:40:17)
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
## 2017-11-14 ENCOUNTER — Telehealth: Payer: Self-pay

## 2017-11-14 ENCOUNTER — Other Ambulatory Visit: Payer: Self-pay | Admitting: Psychiatry

## 2017-11-14 ENCOUNTER — Encounter: Payer: Self-pay | Admitting: Psychiatry

## 2017-11-14 NOTE — Telephone Encounter (Signed)
called and got the bin# and id # from the pharmacy . bin# [id number] id # [id number]

## 2017-11-14 NOTE — Telephone Encounter (Signed)
pt called states that he needs a prior authorization on vraylar .

## 2017-11-14 NOTE — Telephone Encounter (Signed)
went online and completed the [email or web address] prior auth.   -  pending review.

## 2017-11-14 NOTE — Telephone Encounter (Signed)
Ok thanks 

## 2017-11-19 ENCOUNTER — Telehealth: Payer: Self-pay | Admitting: Psychiatry

## 2017-11-19 NOTE — Telephone Encounter (Signed)
CALLED PATIENT TO DISCUSS THAT VRAYLAR DENIED BY HIS PLAN AND TO START ANOTHER MEDICATION.  LEFT MESSAGE .

## 2017-11-29 NOTE — Telephone Encounter (Signed)
the appeal was denied for the vraylar. do you want patient to have enough sample to do untlil patient can see dr. Elna Bresloweappen or does something else needed to be done?

## 2017-11-29 NOTE — Telephone Encounter (Signed)
Medication management - Message left for pt after discussing with Dr. Michae KavaAgarwal to see how things are going since he has not started Vraylar yet that was denied by his insurance? Informed at this time Dr. Michae KavaAgarwal would not write anything else in its place until patient [facility] and requested patient follow up with Dr. Daleen Boavi in the coming week upon her return on Tuesday 12/04/17.  Informed patient if any problems prior to that time how to follow up for emergency care of mood needs on message.  Requested he call the [facility] office back on 12/04/17 to follow up if no problems prior to then.

## 2017-11-29 NOTE — Telephone Encounter (Signed)
This patient has not yet started Vraylar and I am hesitant to give samples if insurance will not approve. Could you review the chart and let us know what you advise - Dr. Elna BreslowEappen is out until next month. Thank you

## 2017-12-04 NOTE — Telephone Encounter (Signed)
I have reviewed Dr. Elvera MariaEappen's note, patient was seen once for initial evaluation. He was unable to obtain the Vraylar prescribed by Dr. Elna BreslowEappen  since insurance has not been covering it. Discussed with Shanda BumpsJessica that he can be given samples of the Vraylar 1.5 mg daily for 3 weeks after which he will be followed up by Dr. Elna BreslowEappen.

## 2017-12-04 NOTE — Telephone Encounter (Signed)
Tc on 12-04-17 left message on patient voicemail that enough samples would be left at front desk for him until he can see dr. Elna BreslowEappen.  Patient assistant paperwork was also but in the bag of samples for the patient to work on and bring back to see if he can receive patient assistant for the medication.

## 2017-12-20 ENCOUNTER — Ambulatory Visit: Payer: 59 | Admitting: Licensed Clinical Social Worker

## 2018-01-01 ENCOUNTER — Ambulatory Visit: Payer: 59 | Admitting: Psychiatry

## 2018-01-05 ENCOUNTER — Other Ambulatory Visit: Payer: Self-pay | Admitting: Psychiatry

## 2018-01-05 DIAGNOSIS — F411 Generalized anxiety disorder: Secondary | ICD-10-CM

## 2018-01-15 DIAGNOSIS — R634 Abnormal weight loss: Secondary | ICD-10-CM | POA: Diagnosis not present

## 2018-01-15 DIAGNOSIS — G479 Sleep disorder, unspecified: Secondary | ICD-10-CM | POA: Diagnosis not present

## 2018-01-15 DIAGNOSIS — I10 Essential (primary) hypertension: Secondary | ICD-10-CM | POA: Diagnosis not present

## 2018-01-15 DIAGNOSIS — E7800 Pure hypercholesterolemia, unspecified: Secondary | ICD-10-CM | POA: Diagnosis not present

## 2018-03-09 ENCOUNTER — Other Ambulatory Visit: Payer: Self-pay | Admitting: Psychiatry

## 2018-03-09 DIAGNOSIS — F411 Generalized anxiety disorder: Secondary | ICD-10-CM

## 2018-04-08 DIAGNOSIS — H0100A Unspecified blepharitis right eye, upper and lower eyelids: Secondary | ICD-10-CM | POA: Diagnosis not present

## 2018-04-08 DIAGNOSIS — H0100B Unspecified blepharitis left eye, upper and lower eyelids: Secondary | ICD-10-CM | POA: Diagnosis not present

## 2018-04-29 DIAGNOSIS — M48062 Spinal stenosis, lumbar region with neurogenic claudication: Secondary | ICD-10-CM | POA: Diagnosis not present

## 2018-05-20 DIAGNOSIS — M545 Low back pain: Secondary | ICD-10-CM | POA: Diagnosis not present

## 2018-05-20 DIAGNOSIS — M5430 Sciatica, unspecified side: Secondary | ICD-10-CM | POA: Diagnosis not present

## 2018-06-25 ENCOUNTER — Other Ambulatory Visit: Payer: Self-pay | Admitting: Family Medicine

## 2018-06-25 DIAGNOSIS — G9519 Other vascular myelopathies: Secondary | ICD-10-CM

## 2018-06-25 DIAGNOSIS — M48062 Spinal stenosis, lumbar region with neurogenic claudication: Secondary | ICD-10-CM

## 2018-07-08 ENCOUNTER — Other Ambulatory Visit: Payer: Self-pay

## 2018-07-14 ENCOUNTER — Other Ambulatory Visit: Payer: Self-pay

## 2018-09-27 DIAGNOSIS — M79606 Pain in leg, unspecified: Secondary | ICD-10-CM | POA: Diagnosis not present

## 2018-09-28 ENCOUNTER — Emergency Department (HOSPITAL_COMMUNITY)
Admission: EM | Admit: 2018-09-28 | Discharge: 2018-09-28 | Disposition: A | Discharge status: Home / Self Care | Payer: 59 | Attending: Emergency Medicine | Admitting: Emergency Medicine

## 2018-09-28 ENCOUNTER — Encounter (HOSPITAL_COMMUNITY): Payer: Self-pay | Admitting: Emergency Medicine

## 2018-09-28 ENCOUNTER — Emergency Department (HOSPITAL_BASED_OUTPATIENT_CLINIC_OR_DEPARTMENT_OTHER): Payer: 59

## 2018-09-28 ENCOUNTER — Emergency Department (HOSPITAL_COMMUNITY): Payer: 59

## 2018-09-28 ENCOUNTER — Other Ambulatory Visit: Payer: Self-pay

## 2018-09-28 DIAGNOSIS — X509XXA Other and unspecified overexertion or strenuous movements or postures, initial encounter: Secondary | ICD-10-CM | POA: Diagnosis not present

## 2018-09-28 DIAGNOSIS — Y929 Unspecified place or not applicable: Secondary | ICD-10-CM | POA: Diagnosis not present

## 2018-09-28 DIAGNOSIS — M79604 Pain in right leg: Secondary | ICD-10-CM

## 2018-09-28 DIAGNOSIS — M79661 Pain in right lower leg: Secondary | ICD-10-CM | POA: Diagnosis not present

## 2018-09-28 DIAGNOSIS — I10 Essential (primary) hypertension: Secondary | ICD-10-CM | POA: Diagnosis not present

## 2018-09-28 DIAGNOSIS — Y999 Unspecified external cause status: Secondary | ICD-10-CM | POA: Insufficient documentation

## 2018-09-28 DIAGNOSIS — S8011XA Contusion of right lower leg, initial encounter: Secondary | ICD-10-CM | POA: Diagnosis not present

## 2018-09-28 DIAGNOSIS — R000 Tachycardia, unspecified: Secondary | ICD-10-CM | POA: Diagnosis not present

## 2018-09-28 DIAGNOSIS — T148XXA Other injury of unspecified body region, initial encounter: Secondary | ICD-10-CM

## 2018-09-28 DIAGNOSIS — Y9389 Activity, other specified: Secondary | ICD-10-CM | POA: Diagnosis not present

## 2018-09-28 DIAGNOSIS — M7989 Other specified soft tissue disorders: Secondary | ICD-10-CM

## 2018-09-28 LAB — BASIC METABOLIC PANEL
Anion gap: 10 (ref 5–15)
BUN: 12 mg/dL (ref 6–20)
CO2: 24 mmol/L (ref 22–32)
Calcium: 9.6 mg/dL (ref 8.9–10.3)
Chloride: 104 mmol/L (ref 98–111)
Creatinine, Ser: 0.9 mg/dL (ref 0.61–1.24)
GFR calc Af Amer: >60 mL/min (ref >60)
GFR calc non Af Amer: >60 mL/min (ref >60)
Glucose, Bld: 116 mg/dL — ABNORMAL HIGH (ref 70–99)
Potassium: 4.1 mmol/L (ref 3.5–5.1)
Sodium: 138 mmol/L (ref 135–145)

## 2018-09-28 LAB — CBC
HCT: 45.3 % (ref 39.0–52.0)
Hemoglobin: 16 g/dL (ref 13.0–17.0)
MCH: 30.8 pg (ref 26.0–34.0)
MCHC: 35.3 g/dL (ref 30.0–36.0)
MCV: 87.3 fL (ref 80.0–100.0)
Platelets: 322 10*3/uL (ref 150–400)
RBC: 5.19 MIL/uL (ref 4.22–5.81)
RDW: 12.2 % (ref 11.5–15.5)
WBC: 11.5 10*3/uL — ABNORMAL HIGH (ref 4.0–10.5)
nRBC: 0 % (ref 0.0–0.2)

## 2018-09-28 LAB — CK: Total CK: 340 U/L (ref 49–397)

## 2018-09-28 MED ORDER — IOHEXOL 300 MG/ML  SOLN
100.0000 mL | Freq: Once | INTRAMUSCULAR | Status: AC | PRN
  Administered 2018-09-28: 19:00:00 100 mL via INTRAVENOUS

## 2018-09-28 MED ORDER — OXYCODONE HCL 5 MG PO TABS
5.0000 mg | ORAL_TABLET | Freq: Once | ORAL | Status: AC
  Administered 2018-09-28: 5 mg via ORAL
  Filled 2018-09-28: qty 1

## 2018-09-28 MED ORDER — OXYCODONE HCL 5 MG PO TABS: 5.0000 mg | ORAL_TABLET | ORAL | 0 refills | Status: AC | PRN

## 2018-09-28 MED ORDER — SODIUM CHLORIDE 0.9 % IV BOLUS
1000.0000 mL | Freq: Once | INTRAVENOUS | Status: AC
  Administered 2018-09-28: 1000 mL via INTRAVENOUS

## 2018-09-28 MED ORDER — LORAZEPAM 1 MG PO TABS
1.0000 mg | ORAL_TABLET | Freq: Once | ORAL | Status: AC
  Administered 2018-09-28: 1 mg via ORAL
  Filled 2018-09-28: qty 1

## 2018-09-28 NOTE — ED Triage Notes (Addendum)
Pt was assessed at urgent care and sent pt here to be evaluated for possible blood clot in right lower extremity. Pt states the pain worsened but has been a factor for 2-[redacted] weeks along with swelling.

## 2018-09-28 NOTE — ED Notes (Addendum)
PT requested to get up to ambulate and urinate. Pt voided with no issues.

## 2018-09-28 NOTE — ED Provider Notes (Signed)
[facility] Emergency Department Provider Note MRN:  [id number]  Arrival date & time: 09/28/18     Chief Complaint   Leg Pain   History of Present Illness   Luke Kim is a 48 y.o. year-old male with a history of hypertension presenting to the ED with chief complaint of leg pain.  2 to 3 days of swelling, pain, firmness to the right calf.  Denies recent trauma, but thinks he sprained the calf about a month ago while trying to push a heavy object.  Seen by urgent care today, sent here for evaluation for blood clot.  Patient denies chest pain or shortness of breath, no fever, no cough, no headache, no vision change, no abdominal pain.  No prior history of blood clots.  No recent extended travel, no recent immobility.  Review of Systems  A complete 10 system review of systems was obtained and all systems are negative except as noted in the HPI and PMH.   Patient's Health History    Past Medical History:  Diagnosis Date  . Anemia   . Anxiety   . Bleeding hemorrhoids   . History of blood transfusion   . Hypertension   . Stomach ulcer     Past Surgical History:  Procedure Laterality Date  . COLONOSCOPY WITH PROPOFOL Left 04/06/2014   Procedure: COLONOSCOPY WITH PROPOFOL;  Surgeon: Petra Kuba, MD;  Location: [facility] ENDOSCOPY;  Service: Endoscopy;  Laterality: Left;  . ESOPHAGOGASTRODUODENOSCOPY (EGD) WITH PROPOFOL Left 04/06/2014   Procedure: ESOPHAGOGASTRODUODENOSCOPY (EGD) WITH PROPOFOL;  Surgeon: Petra Kuba, MD;  Location: [facility] ENDOSCOPY;  Service: Endoscopy;  Laterality: Left;  . TRANSANAL HEMORRHOIDAL DEARTERIALIZATION N/A 06/29/2014   Procedure: TRANSANAL HEMORRHOIDAL DEARTERIALIZATION LIGATION PEXY, EXAM UNDER ANESTHESIA, POSSIBLE HEMORRHOIDECTOMY;  Surgeon: Karie Soda, MD;  Location: [facility] ORS;  Service: General;  Laterality: N/A;    Family History  Problem Relation Age of Onset  . Drug abuse Mother   . Alcohol abuse Mother   . Anxiety disorder Mother   .  Depression Mother   . Alcohol abuse Father   . Drug abuse Father     Social History   Socioeconomic History  . Marital status: Married    Spouse name: varinica  . Number of children: 2  . Years of education: Not on file  . Highest education level: GED or equivalent  Occupational History    Comment: full time  Social Needs  . Financial resource strain: Not hard at all  . Food insecurity:    Worry: Never true    Inability: Never true  . Transportation needs:    Medical: No    Non-medical: No  Tobacco Use  . Smoking status: Never Smoker  . Smokeless tobacco: Never Used  Substance and Sexual Activity  . Alcohol use: Yes    Alcohol/week: 9.0 standard drinks    Types: 7 Glasses of wine, 1 Cans of beer, 1 Shots of liquor per week    Comment: SOCIAL  . Drug use: No  . Sexual activity: Yes  Lifestyle  . Physical activity:    Days per week: 0 days    Minutes per session: 0 min  . Stress: Very much  Relationships  . Social connections:    Talks on phone: More than three times a week    Gets together: Never    Attends religious service: More than 4 times per year    Active member of club or organization: No    Attends meetings of  clubs or organizations: Never    Relationship status: Married  . Intimate partner violence:    Fear of current or ex partner: No    Emotionally abused: No    Physically abused: No    Forced sexual activity: No  Other Topics Concern  . Not on file  Social History Narrative  . Not on file     Physical Exam  Vital Signs and Nursing Notes reviewed Vitals:   09/28/18 1915 09/28/18 1930  BP: 126/83 127/80  Pulse: 72 76  Resp: 19 17  Temp:    SpO2: 99% 98%    CONSTITUTIONAL: Well-appearing, NAD NEURO:  Alert and oriented x 3, no focal deficits EYES:  eyes equal and reactive ENT/NECK:  no LAD, no JVD CARDIO: Tachycardic rate, well-perfused, normal S1 and S2 PULM:  CTAB no wheezing or rhonchi GI/GU:  normal bowel sounds, non-distended,  non-tender MSK/SPINE:  No gross deformities, edematous and firm right calf SKIN: Nonblanching scattered erythematous macules to the distal right leg PSYCH:  Appropriate speech and behavior  Diagnostic and Interventional Summary    EKG Interpretation  Date/Time:  Saturday Sep 28 2018 17:04:28 EDT Ventricular Rate:  118 PR Interval:    QRS Duration: 95 QT Interval:  330 QTC Calculation: 463 R Axis:   67 Text Interpretation:  Sinus tachycardia Confirmed by Kennis Carina [phone]) on 09/28/2018 5:10:38 PM      Labs Reviewed  CBC - Abnormal; Notable for the following components:      Result Value   WBC 11.5 (*)    All other components within normal limits  BASIC METABOLIC PANEL - Abnormal; Notable for the following components:   Glucose, Bld 116 (*)    All other components within normal limits  CK    CT EXTREMITY LOWER RIGHT W CONTRAST  Final Result    VAS [place] LOWER EXTREMITY VENOUS (DVT) (ONLY [facility])        Medications  sodium chloride 0.9 % bolus 1,000 mL (0 mLs Intravenous Stopped 09/28/18 1910)  LORazepam (ATIVAN) tablet 1 mg (1 mg Oral Given 09/28/18 1805)  oxyCODONE (Oxy IR/ROXICODONE) immediate release tablet 5 mg (5 mg Oral Given 09/28/18 1910)  iohexol (OMNIPAQUE) 300 MG/ML solution 100 mL (100 mLs Intravenous Contrast Given 09/28/18 1843)     Procedures Critical Care  ED Course and Medical Decision Making  I have reviewed the triage vital signs and the nursing notes.  Pertinent labs & imaging results that were available during my care of the patient were reviewed by me and considered in my medical decision making (see below for details).  Suspicion for DVT in this 48 year old male, history of hypertension but no DVT history.  No recent risk factors for DVT.  Faint rash distal to the swollen calf consistent with scattered petechia.  Patient denies chest pain or shortness of breath, does have a heart rate in the 110s but explains that he is very nervous and anxious about  being here in the hospital.  Currently with little to no concern for pulmonary embolism but will ensure that heart rate improves.  Clinical Course as of Sep 27 2009  Sat Sep 28, 2018  1816 Ultrasound is negative for DVT.  Given the continued tense compartment, rash, diagnostic uncertainty, will obtain CT imaging.  Considering infection but felt to be unlikely, considering Achilles tendon injury though patient has full extension ability of the foot.   [MB]    Clinical Course User Index [MB] Sabas Sous, MD  Patient's heart rate improved to the 70s after fluids and small dose Ativan for anxiety.  CT scan reveals a likely intramuscular hematoma of the right calf muscle.  This correlates with an injury that patient sustained a few weeks ago while trying to push an object.  There is no evidence of active bleeding on the CT scan.  Patient's leg and foot are neurovascularly intact, strong distal pulses.  The patient's calf is tense but compressible, his labs are reassuring, currently there is no concern for compartment syndrome.  Patient was also informed of the small bony abnormality or lesion of the proximal tibia that needs nonemergent MRI.  We will discuss this with PCP.  We also referred patient to orthopedics to help with this issue as well as the calf hematoma.  Strict return precautions for worsening pain of the right leg or worsening tightness of the calf.  After the discussed management above, the patient was determined to be safe for discharge.  The patient was in agreement with this plan and all questions regarding their care were answered.  ED return precautions were discussed and the patient will return to the ED with any significant worsening of condition.  Elmer SowMichael M. Pilar PlateBero, MD [facility] [email or web address]  Final Clinical Impressions(s) / ED Diagnoses     ICD-10-CM   1. Hematoma T14.8XXA   2. Right leg pain M79.604     ED  Discharge Orders         Ordered    oxyCODONE (ROXICODONE) 5 MG immediate release tablet  Every 4 hours PRN     09/28/18 2009             Sabas SousBero, Michael M, MD 09/28/18 2015

## 2018-09-28 NOTE — Progress Notes (Signed)
Right lower extremity venous duplex completed. Refer to "CV Proc" under chart review to view preliminary results.  09/28/2018 6:31 PM Gertie Fey, MHA, RVT, RDCS, RDMS

## 2018-09-28 NOTE — Discharge Instructions (Addendum)
You were evaluated in the Emergency Department and after careful evaluation, we did not find any emergent condition requiring admission or further testing in the hospital.  As we discussed, there are multiple types of blood clots.  You do not have a dangerous deep vein thrombosis.  You do have a hematoma within your calf muscle that is the likely cause of your discomfort.  This hematoma should get better if you elevate the leg, wear compression stockings or an Ace wrap during the day, use ice or cold compresses, and rest the leg for the next 1 to 2 weeks.  Your CT scan revealed a small abnormality of one of the bones of your right knee.  Our radiologist recommend an MRI to follow-up this finding.  We are referring you to the orthopedic specialists, please call to schedule an appointment.  You can also mention your need for MRI with your primary care doctor, who can schedule it.  Please return to the Emergency Department if you experience any worsening of your condition such as worsening or severe pain of the leg, worsening tightness or solid feeling of the calf, fever, or change in color of the right leg.  We encourage you to follow up with a primary care provider.  Thank you for allowing [place] to be a part of your care.

## 2018-09-30 DIAGNOSIS — G479 Sleep disorder, unspecified: Secondary | ICD-10-CM | POA: Diagnosis not present

## 2018-09-30 DIAGNOSIS — S8010XA Contusion of unspecified lower leg, initial encounter: Secondary | ICD-10-CM | POA: Diagnosis not present

## 2018-11-25 DIAGNOSIS — M25561 Pain in right knee: Secondary | ICD-10-CM | POA: Diagnosis not present

## 2018-12-09 ENCOUNTER — Other Ambulatory Visit: Payer: Self-pay | Admitting: Orthopedic Surgery

## 2018-12-09 DIAGNOSIS — K3189 Other diseases of stomach and duodenum: Secondary | ICD-10-CM

## 2018-12-16 DIAGNOSIS — F411 Generalized anxiety disorder: Secondary | ICD-10-CM | POA: Diagnosis not present

## 2018-12-29 ENCOUNTER — Other Ambulatory Visit: Payer: 59

## 2019-01-08 ENCOUNTER — Other Ambulatory Visit: Payer: Self-pay

## 2019-01-08 ENCOUNTER — Encounter: Payer: Self-pay | Admitting: Adult Health

## 2019-01-08 ENCOUNTER — Ambulatory Visit (INDEPENDENT_AMBULATORY_CARE_PROVIDER_SITE_OTHER): Payer: BC Managed Care – PPO | Admitting: Adult Health

## 2019-01-08 VITALS — BP 137/88 | HR 103 | Ht 71.0 in | Wt 235.0 lb

## 2019-01-08 DIAGNOSIS — G4700 Insomnia, unspecified: Secondary | ICD-10-CM

## 2019-01-08 DIAGNOSIS — F331 Major depressive disorder, recurrent, moderate: Secondary | ICD-10-CM | POA: Diagnosis not present

## 2019-01-08 DIAGNOSIS — F411 Generalized anxiety disorder: Secondary | ICD-10-CM

## 2019-01-08 DIAGNOSIS — F319 Bipolar disorder, unspecified: Secondary | ICD-10-CM | POA: Diagnosis not present

## 2019-01-08 MED ORDER — QUETIAPINE FUMARATE 100 MG PO TABS: ORAL_TABLET | ORAL | 2 refills | Status: DC

## 2019-01-08 MED ORDER — ALPRAZOLAM 1 MG PO TABS: 1.0000 mg | ORAL_TABLET | Freq: Two times a day (BID) | ORAL | 2 refills | Status: AC

## 2019-01-08 MED ORDER — ZOLPIDEM TARTRATE 10 MG PO TABS: 10.0000 mg | ORAL_TABLET | Freq: Every evening | ORAL | 2 refills | Status: AC | PRN

## 2019-01-08 NOTE — Progress Notes (Signed)
[facility] MD/PA/NP Initial Note  01/08/2019 5:05 PM Luke MachoLuke Batra  MRN:  [id number]  Chief Complaint:  Chief Complaint    Depression; Anxiety; Insomnia; Manic Behavior      HPI:  Describes mood today as "so-so". Mood symptoms - reports depression, anxiety, and irritability. Mostly stays "calm", "docile". Feels more anxious than anything. Had a period of depression recently that lasted 5 days. Stating "I have felt that way my whole life". Has previously managed it with "drugs and alcohol". Stating "I have self-medicated all my life". Was in jail for a year in 2004 and 2005. Stating "I've kept it together since then". Got married, had a child - age 48, and works for the state of [place]. His daughter is also Bipolar. Has worked with other psychiatric providers, but has not felt "stable". PCP recently added Seroquel 50mg  at hs. Does not feel like it has helped. Using Xanax and Ambien also. His main concern is not sleeping. Mind races "all the time". Only sleeps 4 hours most nights. Willing to try a higher dose of the Seroquel.  Stable interest and motivation. Taking medications as prescribed.  Energy levels mostly stable. Active, does not have a regular exercise routine. Works 2 jobs - 80 last week.  Enjoys some usual interests and activities. Spending time with family - wife and daughter. Appetite adequate.  Weight stable. Sleeps well most nights. Averages hours. Focus and concentration stable. Completing tasks. Managing aspects of household.  Denies SI or HI.  Denies AH or VH.  Has seen mental health people my whole life - "some of them tell the truth".  Previous medications: Zoloft, Prozac  Current medications: Seroquel 50mg , Ambien 10mg , and Xanax 1mg  TID  Visit Diagnosis:    ICD-10-CM   1. Bipolar I disorder (HCC)  F31.9 QUEtiapine (SEROQUEL) 100 MG tablet  2. Generalized anxiety disorder  F41.1 ALPRAZolam (XANAX) 1 MG tablet    QUEtiapine (SEROQUEL) 100 MG tablet  3. Major depressive  disorder, recurrent episode, moderate (HCC)  F33.1 QUEtiapine (SEROQUEL) 100 MG tablet  4. Insomnia, unspecified type  G47.00 zolpidem (AMBIEN) 10 MG tablet    Past Psychiatric History:   Past Medical History:  Past Medical History:  Diagnosis Date  . Anemia   . Anxiety   . Bipolar disorder (HCC)   . Bleeding hemorrhoids   . Depression   . History of blood transfusion   . Hypertension   . Stomach ulcer     Past Surgical History:  Procedure Laterality Date  . COLONOSCOPY WITH PROPOFOL Left 04/06/2014   Procedure: COLONOSCOPY WITH PROPOFOL;  Surgeon: Petra KubaMarc E Magod, MD;  [facility];  Service: Endoscopy;  Laterality: Left;  . ESOPHAGOGASTRODUODENOSCOPY (EGD) WITH PROPOFOL Left 04/06/2014   Procedure: ESOPHAGOGASTRODUODENOSCOPY (EGD) WITH PROPOFOL;  Surgeon: Petra KubaMarc E Magod, MD;  [facility];  Service: Endoscopy;  Laterality: Left;  . TRANSANAL HEMORRHOIDAL DEARTERIALIZATION N/A 06/29/2014   Procedure: TRANSANAL HEMORRHOIDAL DEARTERIALIZATION LIGATION PEXY, EXAM UNDER ANESTHESIA, POSSIBLE HEMORRHOIDECTOMY;  Surgeon: Karie SodaSteven Gross, MD;  Location: [facility] ORS;  Service: General;  Laterality: N/A;    Family Psychiatric History: Unknown  Family History:  Family History  Problem Relation Age of Onset  . Drug abuse Mother   . Alcohol abuse Mother   . Anxiety disorder Mother   . Depression Mother   . Alcohol abuse Father   . Drug abuse Father     Social History:  Social History   Socioeconomic History  . Marital status: Married    Spouse name: varinica  .  Number of children: 2  . Years of education: Not on file  . Highest education level: GED or equivalent  Occupational History    Comment: full time  Social Needs  . Financial resource strain: Not hard at all  . Food insecurity    Worry: Never true    Inability: Never true  . Transportation needs    Medical: No    Non-medical: No  Tobacco Use  . Smoking status: Never Smoker  . Smokeless tobacco: Never Used   Substance and Sexual Activity  . Alcohol use: Yes    Alcohol/week: 9.0 standard drinks    Types: 7 Glasses of wine, 1 Cans of beer, 1 Shots of liquor per week    Comment: SOCIAL  . Drug use: No  . Sexual activity: Yes  Lifestyle  . Physical activity    Days per week: 0 days    Minutes per session: 0 min  . Stress: Very much  Relationships  . Social connections    Talks on phone: More than three times a week    Gets together: Never    Attends religious service: More than 4 times per year    Active member of club or organization: No    Attends meetings of clubs or organizations: Never    Relationship status: Married  Other Topics Concern  . Not on file  Social History Narrative  . Not on file    Allergies:  Allergies  Allergen Reactions  . Trazodone Other (See Comments)    Nightmares     Metabolic Disorder Labs: No results found for: HGBA1C, MPG No results found for: PROLACTIN No results found for: CHOL, TRIG, HDL, CHOLHDL, VLDL, LDLCALC No results found for: TSH  Therapeutic Level Labs: No results found for: LITHIUM No results found for: VALPROATE No components found for:  CBMZ  Current Medications: Current Outpatient Medications  Medication Sig Dispense Refill  . ALPRAZolam (XANAX) 1 MG tablet Take 1 tablet (1 mg total) by mouth 2 (two) times daily. 60 tablet 2  . amLODipine (NORVASC) 10 MG tablet Take 10 mg by mouth every morning.   0  . benazepril (LOTENSIN) 20 MG tablet Take 20 mg by mouth 2 (two) times daily.      . cetirizine (ZYRTEC) 10 MG tablet Take 10 mg by mouth every morning.    . Cholecalciferol (VITAMIN D-3 PO) Take 1-2 capsules by mouth daily.    . Echinacea-Goldenseal (ECHINACEA COMB/GOLDEN SEAL PO) Take 2 tablets by mouth daily.     . fluticasone (FLONASE) 50 MCG/ACT nasal spray Place 2 sprays into both nostrils daily as needed for allergies or rhinitis.    Marland Kitchen. ibuprofen (ADVIL) 200 MG tablet Take 200-400 mg by mouth every 8 (eight) hours as  needed (for pain or headaches).    . Melatonin 3 MG CAPS Take 3 mg by mouth at bedtime as needed (for sleep).     . Milk Thistle 140 MG CAPS Take 420 mg by mouth 2 (two) times daily.     . Psyllium (METAMUCIL SMOOTH TEXTURE PO) Take 2-3 Scoops by mouth at bedtime. MIX AND DRINK    . Pyridoxine HCl (VITAMIN B-6 PO) Take 1 tablet by mouth every morning.    Marland Kitchen. QUEtiapine (SEROQUEL) 50 MG tablet TAKE 1 TABLET BY MOUTH EVERYDAY AT BEDTIME    . VALERIAN ROOT PO Take 1 tablet by mouth at bedtime.    . vitamin B-12 (CYANOCOBALAMIN) 100 MCG tablet Take 100 mcg by mouth every morning.     .Marland Kitchen  zolpidem (AMBIEN) 10 MG tablet Take 1 tablet (10 mg total) by mouth at bedtime as needed for sleep. 30 tablet 2  . CALCIUM PO Take 1 tablet by mouth at bedtime.    . cariprazine (VRAYLAR) capsule Take 1 capsule (1.5 mg total) by mouth daily. (Patient not taking: Reported on 09/28/2018) 30 capsule 1  . ferrous sulfate 325 (65 FE) MG tablet Take 1 tablet (325 mg total) by mouth daily with breakfast. (Patient not taking: Reported on 09/28/2018) 30 tablet 0  . naproxen sodium (ALEVE) 220 MG tablet Take 440 mg by mouth every morning.    . nortriptyline (PAMELOR) 10 MG capsule Take 1-2 capsules (10-20 mg total) by mouth at bedtime. sleep (Patient not taking: Reported on 09/28/2018) 60 capsule 1  . oxyCODONE (ROXICODONE) 5 MG immediate release tablet Take 1 tablet (5 mg total) by mouth every 4 (four) hours as needed for severe pain. (Patient not taking: Reported on 01/08/2019) 5 tablet 0  . propranolol (INDERAL) 10 MG tablet Take 1 tablet (10 mg total) by mouth 3 (three) times daily as needed. For severe anxiety attacks (Patient not taking: Reported on 09/28/2018) 90 tablet 1  . QUEtiapine (SEROQUEL) 100 MG tablet Take one tablet at hs x 3 nights, then take one and 1/2 tablet at hs x 3 nights, then take two tablets at hs. 60 tablet 2  . sertraline (ZOLOFT) 50 MG tablet Take 50 mg by mouth daily as needed (for premature ejaculation).   1    No current facility-administered medications for this visit.     Medication Side Effects: none  Orders placed this visit:  No orders of the defined types were placed in this encounter.   Psychiatric Specialty Exam:  Review of Systems  Neurological: Sensory change: No.    Blood pressure 137/88, pulse (!) 103, height 5\' 11"  (1.803 m), weight 235 lb (106.6 kg).Body mass index is 32.78 kg/m.  General Appearance: Neat and Well Groomed  Eye Contact:  Good  Speech:  Clear and Coherent  Volume:  Normal  Mood:  Euthymic  Affect:  Congruent  Thought Process:  Coherent  Orientation:  Full (Time, Place, and Person)  Thought Content: Logical   Suicidal Thoughts:  o  Homicidal Thoughts:  No  Memory:  WNL  Judgement:  Good  Insight:  Good  Psychomotor Activity:  Normal  Concentration:  Concentration: Good  Recall:  Good  Fund of Knowledge: Good  Language: Good  Assets:  Communication Skills Desire for Improvement Financial Resources/Insurance Housing Intimacy Leisure Time Physical Health Resilience Social Support Talents/Skills Transportation Vocational/Educational  ADL's:  Intact  Cognition: WNL  Prognosis:  Good   Screenings: None  Receiving Psychotherapy: No   Treatment Plan/Recommendations:    Plan:  1. Xanax 1mg  BID 2. Ambien 10mg  tab at hs 3. Increase Seroquel 50mg  to 100mg  at hs x 3 nights, then 150mg  at hs x 3 nights, then 200mg  at hs. May stop prior to reaching 200mg  if sleeping.  RTC 4 weeks   Aloha Gell, NP

## 2019-01-15 ENCOUNTER — Telehealth: Payer: Self-pay | Admitting: Adult Health

## 2019-01-15 NOTE — Telephone Encounter (Signed)
Pt called to advise Seroquel working well just staying very tired during the day. What can be done for this? [phone]

## 2019-01-16 NOTE — Telephone Encounter (Signed)
Left detailed message with information and to call back for further questions or concerns

## 2019-01-30 ENCOUNTER — Ambulatory Visit: Payer: BC Managed Care – PPO | Admitting: Adult Health

## 2019-01-30 ENCOUNTER — Other Ambulatory Visit: Payer: Self-pay

## 2019-02-01 ENCOUNTER — Other Ambulatory Visit: Payer: Self-pay | Admitting: Adult Health

## 2019-02-01 DIAGNOSIS — F319 Bipolar disorder, unspecified: Secondary | ICD-10-CM

## 2019-02-01 DIAGNOSIS — F411 Generalized anxiety disorder: Secondary | ICD-10-CM

## 2019-02-01 DIAGNOSIS — F331 Major depressive disorder, recurrent, moderate: Secondary | ICD-10-CM

## 2019-02-02 NOTE — Telephone Encounter (Signed)
Has appt. tomorrow

## 2019-02-03 ENCOUNTER — Ambulatory Visit: Payer: BC Managed Care – PPO | Admitting: Adult Health

## 2019-02-05 ENCOUNTER — Encounter: Payer: Self-pay | Admitting: Adult Health

## 2019-02-05 ENCOUNTER — Ambulatory Visit (INDEPENDENT_AMBULATORY_CARE_PROVIDER_SITE_OTHER): Payer: BC Managed Care – PPO | Admitting: Adult Health

## 2019-02-05 ENCOUNTER — Other Ambulatory Visit: Payer: Self-pay

## 2019-02-05 DIAGNOSIS — F331 Major depressive disorder, recurrent, moderate: Secondary | ICD-10-CM | POA: Diagnosis not present

## 2019-02-05 DIAGNOSIS — F411 Generalized anxiety disorder: Secondary | ICD-10-CM

## 2019-02-05 DIAGNOSIS — F410 Panic disorder [episodic paroxysmal anxiety] without agoraphobia: Secondary | ICD-10-CM

## 2019-02-05 DIAGNOSIS — F319 Bipolar disorder, unspecified: Secondary | ICD-10-CM | POA: Diagnosis not present

## 2019-02-05 DIAGNOSIS — G4700 Insomnia, unspecified: Secondary | ICD-10-CM

## 2019-02-05 MED ORDER — QUETIAPINE FUMARATE 300 MG PO TABS: 300.0000 mg | ORAL_TABLET | Freq: Every day | ORAL | 2 refills | Status: DC

## 2019-02-05 NOTE — Progress Notes (Signed)
[facility] MD/PA/NP Initial Note  02/05/2019 3:39 PM Luke MachoLuke Kim  MRN:  [id number]  Chief Complaint:    HPI:  Describes mood today as "ok". Pleasant. Mood symptoms - decreased depression, anxiety, and irritability. Not feeling as anxious. Stating "that is getting better". Mind "no longer races". Sleep has improved with increase in Seroquel. Does not feel like Ambien is helpful. Co-workers have noticed an improvement in his mood - "stating I seem happier". Stating "I feel it too". Stable interest and motivation. Taking medications as prescribed.  Energy levels mostly stable. Active, does not have a regular exercise routine. Works 2 jobs - 40 hours this week.   Enjoys some usual interests and activities. Spending time with family - wife and daughter. Mostly staying home. Has a beach trip planned. Has 20 people coming up for Thanksgiving.  Appetite increased  - has gained 25 pounds in 3 weeks - 235 to 255. Sleeps better at night. Averages 5 to 6 hours. Focus and concentration stable. Completing tasks. Managing aspects of household.  Denies SI or HI. Denies AH or VH.  Previous medications: Zoloft, Prozac  Current medications: Seroquel 50mg , Ambien 10mg , and Xanax 1mg  TID  Visit Diagnosis:    ICD-10-CM   1. Bipolar I disorder (HCC)  F31.9 QUEtiapine (SEROQUEL) 300 MG tablet  2. Generalized anxiety disorder  F41.1   3. Major depressive disorder, recurrent episode, moderate (HCC)  F33.1   4. Insomnia, unspecified type  G47.00   5. Panic attacks  F41.0     Past Psychiatric History: ADHD   Past Medical History:  Past Medical History:  Diagnosis Date  . Anemia   . Anxiety   . Bipolar disorder (HCC)   . Bleeding hemorrhoids   . Depression   . History of blood transfusion   . Hypertension   . Stomach ulcer     Past Surgical History:  Procedure Laterality Date  . COLONOSCOPY WITH PROPOFOL Left 04/06/2014   Procedure: COLONOSCOPY WITH PROPOFOL;  Surgeon: Petra KubaMarc E Magod, MD;  [facility];  Service: Endoscopy;  Laterality: Left;  . ESOPHAGOGASTRODUODENOSCOPY (EGD) WITH PROPOFOL Left 04/06/2014   Procedure: ESOPHAGOGASTRODUODENOSCOPY (EGD) WITH PROPOFOL;  Surgeon: Petra KubaMarc E Magod, MD;  [facility];  Service: Endoscopy;  Laterality: Left;  . TRANSANAL HEMORRHOIDAL DEARTERIALIZATION N/A 06/29/2014   Procedure: TRANSANAL HEMORRHOIDAL DEARTERIALIZATION LIGATION PEXY, EXAM UNDER ANESTHESIA, POSSIBLE HEMORRHOIDECTOMY;  Surgeon: Karie SodaSteven Gross, MD;  Location: [facility];  Service: General;  Laterality: N/A;    Family Psychiatric History: Unknown  Family History:  Family History  Problem Relation Age of Onset  . Drug abuse Mother   . Alcohol abuse Mother   . Anxiety disorder Mother   . Depression Mother   . Alcohol abuse Father   . Drug abuse Father     Social History:  Social History   Socioeconomic History  . Marital status: Married    Spouse name: Luke Kim  . Number of children: 2  . Years of education: Not on file  . Highest education level: GED or equivalent  Occupational History    Comment: full time  Social Needs  . Financial resource strain: Not hard at all  . Food insecurity    Worry: Never true    Inability: Never true  . Transportation needs    Medical: No    Non-medical: No  Tobacco Use  . Smoking status: Never Smoker  . Smokeless tobacco: Never Used  Substance and Sexual Activity  . Alcohol use: Yes    Alcohol/week:  9.0 standard drinks    Types: 7 Glasses of wine, 1 Cans of beer, 1 Shots of liquor per week    Comment: SOCIAL  . Drug use: No  . Sexual activity: Yes  Lifestyle  . Physical activity    Days per week: 0 days    Minutes per session: 0 min  . Stress: Very much  Relationships  . Social connections    Talks on phone: More than three times a week    Gets together: Never    Attends religious service: More than 4 times per year    Active member of club or organization: No    Attends meetings of clubs or organizations:  Never    Relationship status: Married  Other Topics Concern  . Not on file  Social History Narrative  . Not on file    Allergies:  Allergies  Allergen Reactions  . Trazodone Other (See Comments)    Nightmares     Metabolic Disorder Labs: No results found for: HGBA1C, MPG No results found for: PROLACTIN No results found for: CHOL, TRIG, HDL, CHOLHDL, VLDL, LDLCALC No results found for: TSH  Therapeutic Level Labs: No results found for: LITHIUM No results found for: VALPROATE No components found for:  CBMZ  Current Medications: Current Outpatient Medications  Medication Sig Dispense Refill  . ALPRAZolam (XANAX) 1 MG tablet Take 1 tablet (1 mg total) by mouth 2 (two) times daily. 60 tablet 2  . amLODipine (NORVASC) 10 MG tablet Take 10 mg by mouth every morning.   0  . benazepril (LOTENSIN) 20 MG tablet Take 20 mg by mouth 2 (two) times daily.      Marland Kitchen CALCIUM PO Take 1 tablet by mouth at bedtime.    . cariprazine (VRAYLAR) capsule Take 1 capsule (1.5 mg total) by mouth daily. (Patient not taking: Reported on 09/28/2018) 30 capsule 1  . cetirizine (ZYRTEC) 10 MG tablet Take 10 mg by mouth every morning.    . Cholecalciferol (VITAMIN D-3 PO) Take 1-2 capsules by mouth daily.    . Echinacea-Goldenseal (ECHINACEA COMB/GOLDEN SEAL PO) Take 2 tablets by mouth daily.     . ferrous sulfate 325 (65 FE) MG tablet Take 1 tablet (325 mg total) by mouth daily with breakfast. (Patient not taking: Reported on 09/28/2018) 30 tablet 0  . fluticasone (FLONASE) 50 MCG/ACT nasal spray Place 2 sprays into both nostrils daily as needed for allergies or rhinitis.    Marland Kitchen ibuprofen (ADVIL) 200 MG tablet Take 200-400 mg by mouth every 8 (eight) hours as needed (for pain or headaches).    . Melatonin 3 MG CAPS Take 3 mg by mouth at bedtime as needed (for sleep).     . Milk Thistle 140 MG CAPS Take 420 mg by mouth 2 (two) times daily.     . naproxen sodium (ALEVE) 220 MG tablet Take 440 mg by mouth every  morning.    . nortriptyline (PAMELOR) 10 MG capsule Take 1-2 capsules (10-20 mg total) by mouth at bedtime. sleep (Patient not taking: Reported on 09/28/2018) 60 capsule 1  . oxyCODONE (ROXICODONE) 5 MG immediate release tablet Take 1 tablet (5 mg total) by mouth every 4 (four) hours as needed for severe pain. (Patient not taking: Reported on 01/08/2019) 5 tablet 0  . propranolol (INDERAL) 10 MG tablet Take 1 tablet (10 mg total) by mouth 3 (three) times daily as needed. For severe anxiety attacks (Patient not taking: Reported on 09/28/2018) 90 tablet 1  . Psyllium (  METAMUCIL SMOOTH TEXTURE PO) Take 2-3 Scoops by mouth at bedtime. [place]    . Pyridoxine HCl (VITAMIN B-6 PO) Take 1 tablet by mouth every morning.    Marland Kitchen QUEtiapine (SEROQUEL) 100 MG tablet Take one tablet at hs x 3 nights, then take one and 1/2 tablet at hs x 3 nights, then take two tablets at hs. 60 tablet 2  . QUEtiapine (SEROQUEL) 300 MG tablet Take 1 tablet (300 mg total) by mouth at bedtime. 30 tablet 2  . QUEtiapine (SEROQUEL) 50 MG tablet TAKE 1 TABLET BY MOUTH EVERYDAY AT BEDTIME    . sertraline (ZOLOFT) 50 MG tablet Take 50 mg by mouth daily as needed (for premature ejaculation).   1  . VALERIAN ROOT PO Take 1 tablet by mouth at bedtime.    . vitamin B-12 (CYANOCOBALAMIN) 100 MCG tablet Take 100 mcg by mouth every morning.     . zolpidem (AMBIEN) 10 MG tablet Take 1 tablet (10 mg total) by mouth at bedtime as needed for sleep. 30 tablet 2   No current facility-administered medications for this visit.     Medication Side Effects: none  Orders placed this visit:  No orders of the defined types were placed in this encounter.   Psychiatric Specialty Exam:  Review of Systems  Neurological: Sensory change: No.    There were no vitals taken for this visit.There is no height or weight on file to calculate BMI.  General Appearance: Neat and Well Groomed  Eye Contact:  Good  Speech:  Clear and Coherent  Volume:  Normal   Mood:  Euthymic  Affect:  Congruent  Thought Process:  Coherent  Orientation:  Full (Time, Place, and Person)  Thought Content: Logical   Suicidal Thoughts:  No  Homicidal Thoughts:  No  Memory:  WNL  Judgement:  Good  Insight:  Good  Psychomotor Activity:  Normal  Concentration:  Concentration: Good  Recall:  Good  Fund of Knowledge: Good  Language: Good  Assets:  Communication Skills Desire for Improvement Financial Resources/Insurance Housing Intimacy Leisure Time Physical Health Resilience Social Support Talents/Skills Transportation Vocational/Educational  ADL's:  Intact  Cognition: WNL  Prognosis:  Good   Screenings: None  Receiving Psychotherapy: No   Treatment Plan/Recommendations:    Plan:  1. Xanax 1mg  BID 2. Ambien 10mg  tab at hs 3. Increase Seroquel 200mg  to 300mg  at hs - (take 250mg  at hs x 3, then increase to 300mg )   Add wellbutrin if no seizure history  RTC 4 weeks   Aloha Gell, NP

## 2019-02-27 ENCOUNTER — Other Ambulatory Visit: Payer: Self-pay | Admitting: Adult Health

## 2019-02-27 ENCOUNTER — Telehealth: Payer: Self-pay | Admitting: Adult Health

## 2019-02-27 ENCOUNTER — Other Ambulatory Visit: Payer: Self-pay

## 2019-02-27 DIAGNOSIS — F319 Bipolar disorder, unspecified: Secondary | ICD-10-CM

## 2019-02-27 MED ORDER — QUETIAPINE FUMARATE 300 MG PO TABS: 300.0000 mg | ORAL_TABLET | Freq: Every day | ORAL | 0 refills | Status: AC

## 2019-02-27 NOTE — Telephone Encounter (Signed)
Patient need 90 day refill of Seroquil to be sent to [facility] in [place]

## 2019-02-27 NOTE — Telephone Encounter (Signed)
Requesting 90 day refill for seroquel 300 mg

## 2019-02-27 NOTE — Telephone Encounter (Signed)
90 day submitted 

## 2019-03-05 ENCOUNTER — Other Ambulatory Visit: Payer: Self-pay

## 2019-03-05 ENCOUNTER — Ambulatory Visit (INDEPENDENT_AMBULATORY_CARE_PROVIDER_SITE_OTHER): Payer: BC Managed Care – PPO | Admitting: Adult Health

## 2019-03-05 ENCOUNTER — Encounter: Payer: Self-pay | Admitting: Adult Health

## 2019-03-05 ENCOUNTER — Encounter (INDEPENDENT_AMBULATORY_CARE_PROVIDER_SITE_OTHER): Payer: Self-pay

## 2019-03-05 DIAGNOSIS — F319 Bipolar disorder, unspecified: Secondary | ICD-10-CM | POA: Diagnosis not present

## 2019-03-05 DIAGNOSIS — F1421 Cocaine dependence, in remission: Secondary | ICD-10-CM

## 2019-03-05 DIAGNOSIS — G4700 Insomnia, unspecified: Secondary | ICD-10-CM

## 2019-03-05 DIAGNOSIS — F411 Generalized anxiety disorder: Secondary | ICD-10-CM

## 2019-03-05 DIAGNOSIS — F410 Panic disorder [episodic paroxysmal anxiety] without agoraphobia: Secondary | ICD-10-CM

## 2019-03-05 DIAGNOSIS — F331 Major depressive disorder, recurrent, moderate: Secondary | ICD-10-CM

## 2019-03-05 DIAGNOSIS — F1221 Cannabis dependence, in remission: Secondary | ICD-10-CM

## 2019-03-05 DIAGNOSIS — F1320 Sedative, hypnotic or anxiolytic dependence, uncomplicated: Secondary | ICD-10-CM

## 2019-03-05 MED ORDER — TOPIRAMATE 25 MG PO TABS: ORAL_TABLET | ORAL | 2 refills | Status: AC

## 2019-03-05 NOTE — Progress Notes (Signed)
[facility] MD/PA/NP Medication Check 03/05/2019 6:11 PM Luke BurgessLuke Kim  MRN:  [id number]  Chief Complaint:    HPI:  Describes mood today as "ok". Pleasant. Mood symptoms - decreased depression, anxiety, and irritability. Stating "I'm doing much better". Mind "no longer races like it did". Wife and people at work have commented on his "progress". Feels like there is still room for "improvement". Does not "like" that his appetite has increased - "I get the munches at night". Feels like he is gaining weight and plans to be "more careful".  Stable interest and motivation. Taking medications as prescribed.  Energy levels mostly stable. Active, does not have a regular exercise routine. Works 2 jobs. Enjoys some usual interests and activities. Spending time with family - wife and daughter. Mostly staying home. Looking forward to the holidays.  Appetite increased. Weight gain - 264 today in office. Sleeps well most nights. Averages 5 to 6 hours. Feels "draggy" in the morning but notes "it passes quickly". Focus and concentration difficulties. Starting and not finishing projects - "getting on tangents". Managing aspects of household. Work going well. Denies SI or HI. Denies AH or VH.  Previous medications: Zoloft, Prozac  Current medications: Seroquel 50mg , Ambien 10mg , and Xanax 1mg  TID  Visit Diagnosis:    ICD-10-CM   1. Bipolar I disorder (HCC)  F31.9 topiramate (TOPAMAX) 25 MG tablet  2. Generalized anxiety disorder  F41.1   3. Major depressive disorder, recurrent episode, moderate (HCC)  F33.1   4. Insomnia, unspecified type  G47.00   5. Panic attacks  F41.0   6. Benzodiazepine dependence (HCC)  F13.20   7. Cannabis use disorder, moderate, in sustained remission (HCC)  F12.21   8. Cocaine use disorder, moderate, in sustained remission (HCC)  F14.21     Past Psychiatric History: ADHD   Past Medical History:  Past Medical History:  Diagnosis Date  . Anemia   . Anxiety   . Bipolar  disorder (HCC)   . Bleeding hemorrhoids   . Depression   . History of blood transfusion   . Hypertension   . Stomach ulcer     Past Surgical History:  Procedure Laterality Date  . COLONOSCOPY WITH PROPOFOL Left 04/06/2014   Procedure: COLONOSCOPY WITH PROPOFOL;  Surgeon: Petra KubaMarc E Magod, MD;  [facility];  Service: Endoscopy;  Laterality: Left;  . ESOPHAGOGASTRODUODENOSCOPY (EGD) WITH PROPOFOL Left 04/06/2014   Procedure: ESOPHAGOGASTRODUODENOSCOPY (EGD) WITH PROPOFOL;  Surgeon: Petra KubaMarc E Magod, MD;  [facility];  Service: Endoscopy;  Laterality: Left;  . TRANSANAL HEMORRHOIDAL DEARTERIALIZATION N/A 06/29/2014   Procedure: TRANSANAL HEMORRHOIDAL DEARTERIALIZATION LIGATION PEXY, EXAM UNDER ANESTHESIA, POSSIBLE HEMORRHOIDECTOMY;  Surgeon: Karie SodaSteven Gross, MD;  Location: [facility];  Service: General;  Laterality: N/A;    Family Psychiatric History: Unknown  Family History:  Family History  Problem Relation Age of Onset  . Drug abuse Mother   . Alcohol abuse Mother   . Anxiety disorder Mother   . Depression Mother   . Alcohol abuse Father   . Drug abuse Father     Social History:  Social History   Socioeconomic History  . Marital status: Married    Spouse name: varinica  . Number of children: 2  . Years of education: Not on file  . Highest education level: GED or equivalent  Occupational History    Comment: full time  Social Needs  . Financial resource strain: Not hard at all  . Food insecurity    Worry: Never true    Inability:  Never true  . Transportation needs    Medical: No    Non-medical: No  Tobacco Use  . Smoking status: Never Smoker  . Smokeless tobacco: Never Used  Substance and Sexual Activity  . Alcohol use: Yes    Alcohol/week: 9.0 standard drinks    Types: 7 Glasses of wine, 1 Cans of beer, 1 Shots of liquor per week    Comment: SOCIAL  . Drug use: No  . Sexual activity: Yes  Lifestyle  . Physical activity    Days per week: 0 days     Minutes per session: 0 min  . Stress: Very much  Relationships  . Social connections    Talks on phone: More than three times a week    Gets together: Never    Attends religious service: More than 4 times per year    Active member of club or organization: No    Attends meetings of clubs or organizations: Never    Relationship status: Married  Other Topics Concern  . Not on file  Social History Narrative  . Not on file    Allergies:  Allergies  Allergen Reactions  . Trazodone Other (See Comments)    Nightmares     Metabolic Disorder Labs: No results found for: HGBA1C, MPG No results found for: PROLACTIN No results found for: CHOL, TRIG, HDL, CHOLHDL, VLDL, LDLCALC No results found for: TSH  Therapeutic Level Labs: No results found for: LITHIUM No results found for: VALPROATE No components found for:  CBMZ  Current Medications: Current Outpatient Medications  Medication Sig Dispense Refill  . ALPRAZolam (XANAX) 1 MG tablet Take 1 tablet (1 mg total) by mouth 2 (two) times daily. 60 tablet 2  . amLODipine (NORVASC) 10 MG tablet Take 10 mg by mouth every morning.   0  . benazepril (LOTENSIN) 20 MG tablet Take 20 mg by mouth 2 (two) times daily.      Marland Kitchen CALCIUM PO Take 1 tablet by mouth at bedtime.    . cetirizine (ZYRTEC) 10 MG tablet Take 10 mg by mouth every morning.    . Cholecalciferol (VITAMIN D-3 PO) Take 1-2 capsules by mouth daily.    . Echinacea-Goldenseal (ECHINACEA COMB/GOLDEN SEAL PO) Take 2 tablets by mouth daily.     . ferrous sulfate 325 (65 FE) MG tablet Take 1 tablet (325 mg total) by mouth daily with breakfast. (Patient not taking: Reported on 09/28/2018) 30 tablet 0  . fluticasone (FLONASE) 50 MCG/ACT nasal spray Place 2 sprays into both nostrils daily as needed for allergies or rhinitis.    Marland Kitchen ibuprofen (ADVIL) 200 MG tablet Take 200-400 mg by mouth every 8 (eight) hours as needed (for pain or headaches).    . Melatonin 3 MG CAPS Take 3 mg by mouth at  bedtime as needed (for sleep).     . Milk Thistle 140 MG CAPS Take 420 mg by mouth 2 (two) times daily.     . naproxen sodium (ALEVE) 220 MG tablet Take 440 mg by mouth every morning.    Marland Kitchen oxyCODONE (ROXICODONE) 5 MG immediate release tablet Take 1 tablet (5 mg total) by mouth every 4 (four) hours as needed for severe pain. (Patient not taking: Reported on 01/08/2019) 5 tablet 0  . propranolol (INDERAL) 10 MG tablet Take 1 tablet (10 mg total) by mouth 3 (three) times daily as needed. For severe anxiety attacks (Patient not taking: Reported on 09/28/2018) 90 tablet 1  . Psyllium (METAMUCIL SMOOTH TEXTURE PO) Take  2-3 Scoops by mouth at bedtime. MIX AND DRINK    . Pyridoxine HCl (VITAMIN B-6 PO) Take 1 tablet by mouth every morning.    Marland Kitchen QUEtiapine (SEROQUEL) 300 MG tablet Take 1 tablet (300 mg total) by mouth at bedtime. 90 tablet 0  . topiramate (TOPAMAX) 25 MG tablet Take one tablet at bedtime. 30 tablet 2  . VALERIAN ROOT PO Take 1 tablet by mouth at bedtime.    . vitamin B-12 (CYANOCOBALAMIN) 100 MCG tablet Take 100 mcg by mouth every morning.     . zolpidem (AMBIEN) 10 MG tablet Take 1 tablet (10 mg total) by mouth at bedtime as needed for sleep. 30 tablet 2   No current facility-administered medications for this visit.     Medication Side Effects: none  Orders placed this visit:  No orders of the defined types were placed in this encounter.   Psychiatric Specialty Exam:  Review of Systems  Neurological: Negative for tremors and weakness. Sensory change: No.    There were no vitals taken for this visit.There is no height or weight on file to calculate BMI.  General Appearance: Neat and Well Groomed  Eye Contact:  Good  Speech:  Clear and Coherent  Volume:  Normal  Mood:  Euthymic  Affect:  Congruent  Thought Process:  Coherent  Orientation:  Full (Time, Place, and Person)  Thought Content: Logical   Suicidal Thoughts:  No  Homicidal Thoughts:  No  Memory:  WNL  Judgement:   Good  Insight:  Good  Psychomotor Activity:  Normal  Concentration:  Concentration: Good  Recall:  Good  Fund of Knowledge: Good  Language: Good  Assets:  Communication Skills Desire for Improvement Financial Resources/Insurance Housing Intimacy Leisure Time Physical Health Resilience Social Support Talents/Skills Transportation Vocational/Educational  ADL's:  Intact  Cognition: WNL  Prognosis:  Good   Screenings: None  Receiving Psychotherapy: No   Treatment Plan/Recommendations:   Plan:  1. Xanax 1mg  BID 2. Ambien 10mg  tab at hs 3. Seroquel 300mg  at hs  4. Add Topamax 25mg  at hs  Check weight at each visit  RTC 4 weeks  Discussed potential metabolic side effects associated with atypical antipsychotics, as well as potential risk for movement side effects. Advised pt to contact office if movement side effects occur.   Patient advised to contact office with any questions, adverse effects, or acute worsening in signs and symptoms.   , NP

## 2019-03-17 ENCOUNTER — Ambulatory Visit
Admission: RE | Admit: 2019-03-17 | Discharge: 2019-03-17 | Disposition: A | Discharge status: Home / Self Care | Payer: BC Managed Care – PPO | Source: Ambulatory Visit | Attending: Family Medicine | Admitting: Family Medicine

## 2019-03-17 ENCOUNTER — Other Ambulatory Visit: Payer: Self-pay | Admitting: Family Medicine

## 2019-03-17 DIAGNOSIS — M25461 Effusion, right knee: Secondary | ICD-10-CM

## 2019-03-17 DIAGNOSIS — M25561 Pain in right knee: Secondary | ICD-10-CM

## 2019-04-02 ENCOUNTER — Ambulatory Visit: Payer: BC Managed Care – PPO | Admitting: Adult Health

## 2019-04-22 DEATH — deceased

## 2020-08-06 IMAGING — CR DG KNEE COMPLETE 4+V*R*
4 series · 4 of 4 positions shown · non-contrast
Comparison: Right knee radiographs 11/25/2018

CLINICAL DATA: Fall 1 week ago. Pain and swelling

EXAM:
RIGHT KNEE - COMPLETE 4+ VIEW

[t knee ap right]
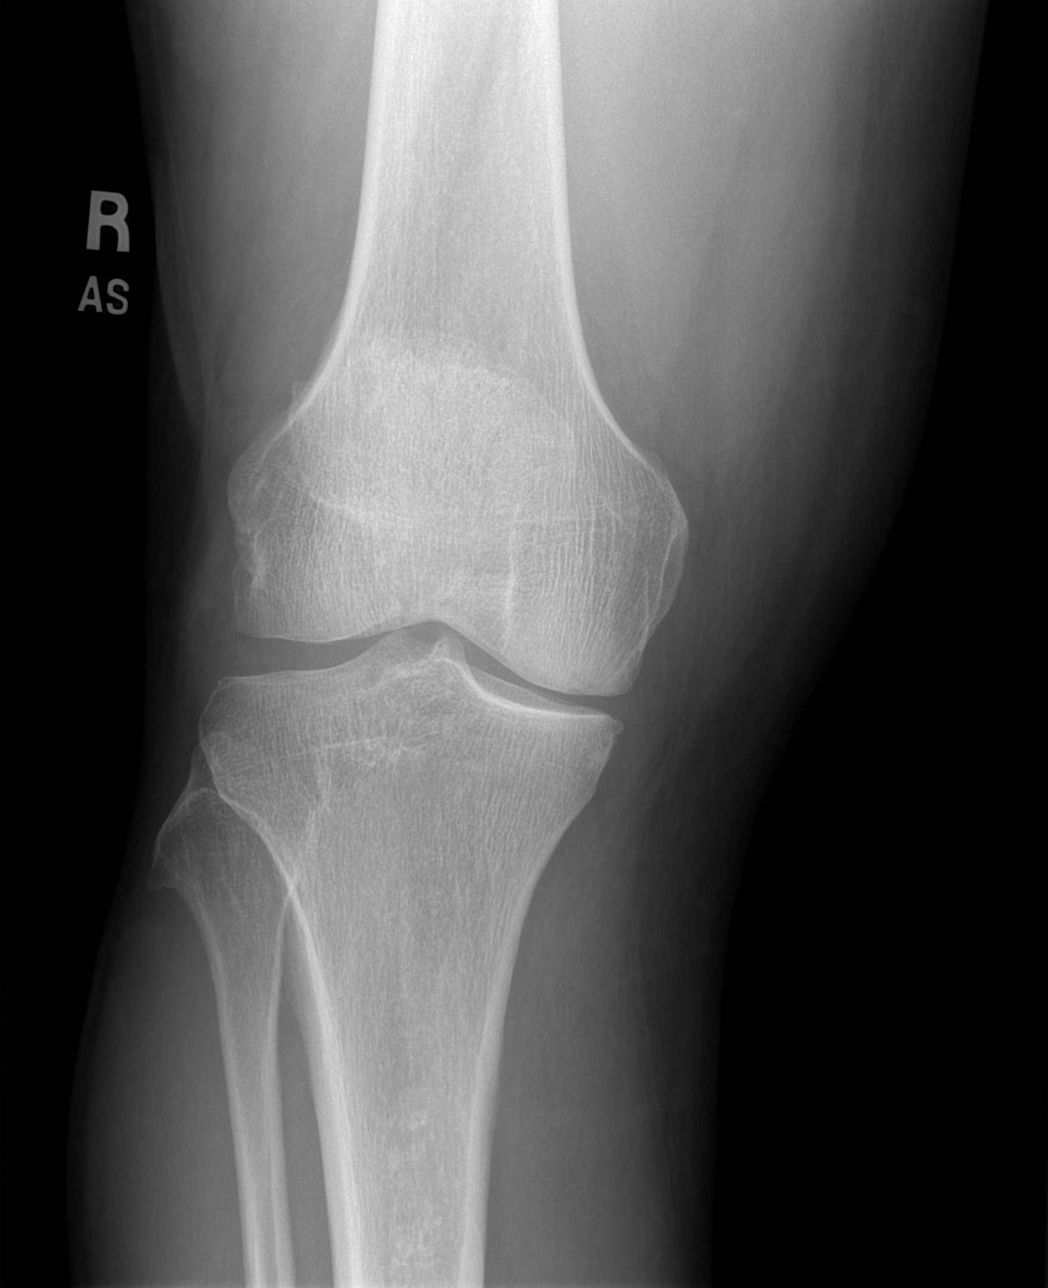

[t knee oblique right (1 of 2)]
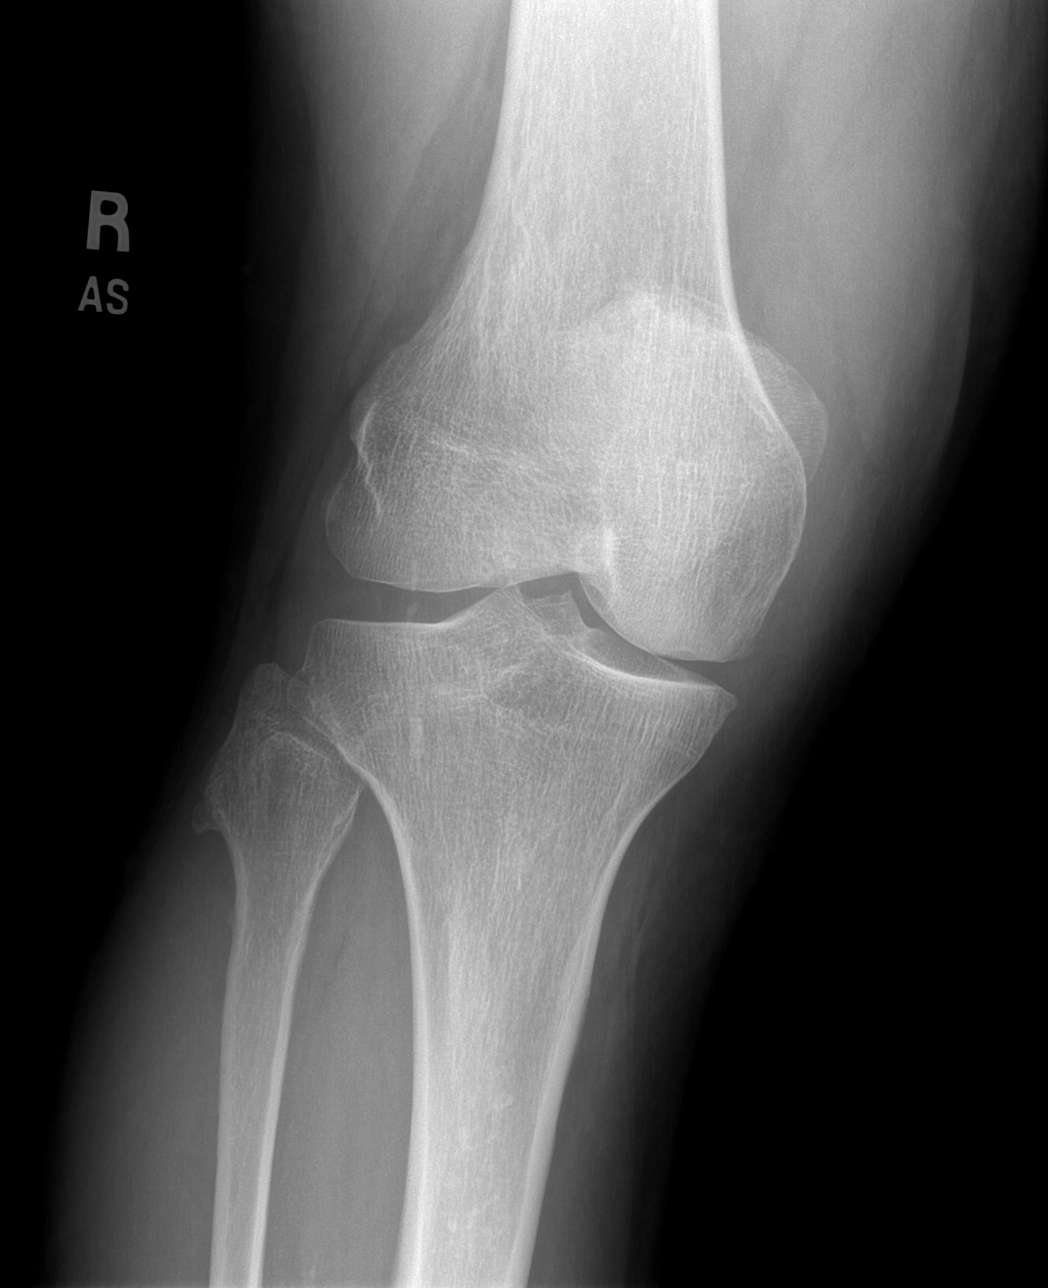

[t knee oblique right (2 of 2)]
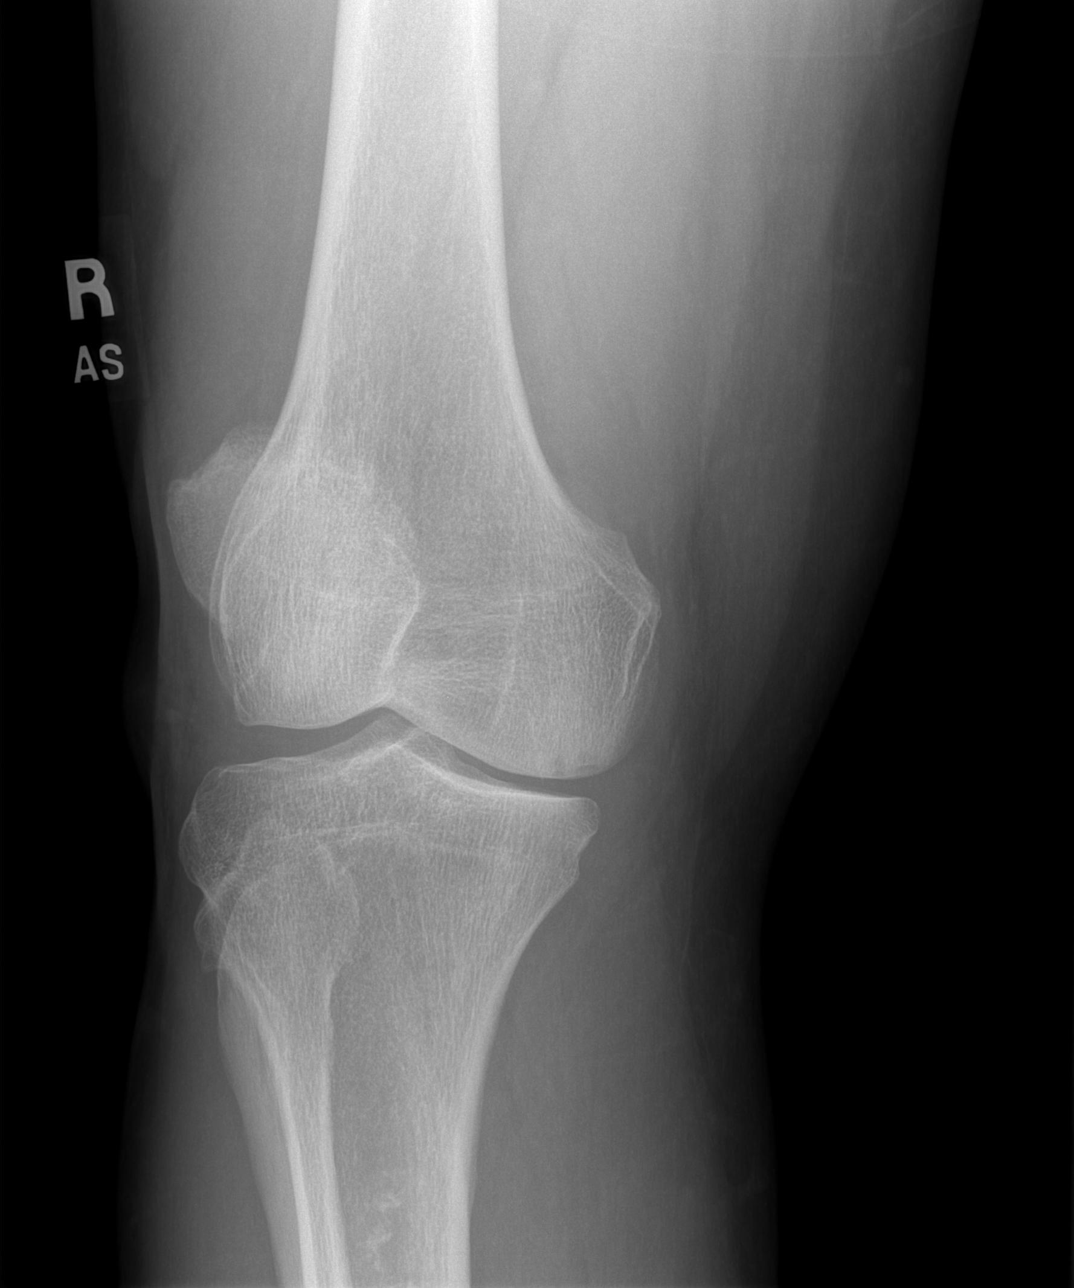

[t knee lat right]
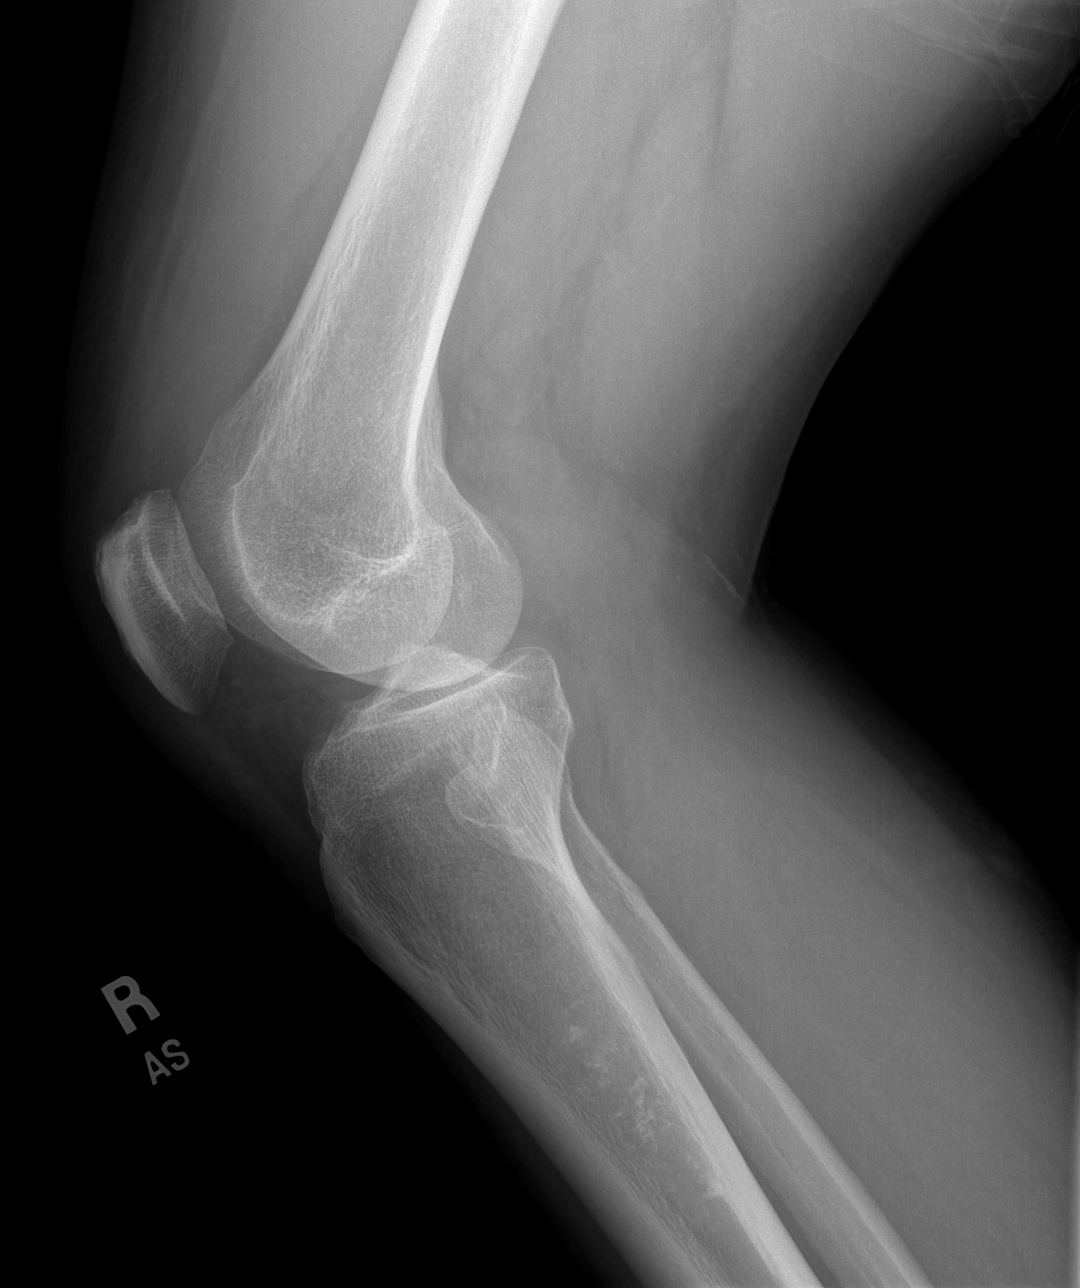

[4 of 4 positions shown; findings below may reference images not displayed]

FINDINGS: Right knee is located. Asymmetric medial joint space is noted. A
small effusion is present. No acute or healing osseous abnormalities
are present.
IMPRESSION: 1. Asymmetric medial joint space narrowing.
2. Small effusion.
3. No acute or healing osseous abnormality.
